# Patient Record
Sex: Male | Born: 1935 | Race: White | Hispanic: No | Smoking: Former smoker
Health system: Southern US, Community
[De-identification: ages and names within clinical notes are randomized; demographics above are authoritative.]

## PROBLEM LIST (undated history)

## (undated) DIAGNOSIS — T8859XA Other complications of anesthesia, initial encounter: Secondary | ICD-10-CM

## (undated) DIAGNOSIS — H409 Unspecified glaucoma: Secondary | ICD-10-CM

## (undated) DIAGNOSIS — F419 Anxiety disorder, unspecified: Secondary | ICD-10-CM

## (undated) DIAGNOSIS — D649 Anemia, unspecified: Secondary | ICD-10-CM

## (undated) DIAGNOSIS — T7840XA Allergy, unspecified, initial encounter: Secondary | ICD-10-CM

## (undated) DIAGNOSIS — E039 Hypothyroidism, unspecified: Secondary | ICD-10-CM

## (undated) DIAGNOSIS — C189 Malignant neoplasm of colon, unspecified: Secondary | ICD-10-CM

## (undated) DIAGNOSIS — T4145XA Adverse effect of unspecified anesthetic, initial encounter: Secondary | ICD-10-CM

## (undated) HISTORY — DX: Unspecified glaucoma: H40.9

## (undated) HISTORY — DX: Hypothyroidism, unspecified: E03.9

## (undated) HISTORY — DX: Malignant neoplasm of colon, unspecified: C18.9

## (undated) HISTORY — DX: Anemia, unspecified: D64.9

## (undated) HISTORY — DX: Allergy, unspecified, initial encounter: T78.40XA

## (undated) HISTORY — DX: Anxiety disorder, unspecified: F41.9

---

## 1989-01-04 HISTORY — PX: TEAR DUCT PROBING: SHX793

## 2001-03-06 ENCOUNTER — Encounter: Payer: Self-pay | Admitting: Emergency Medicine

## 2001-03-06 ENCOUNTER — Emergency Department (HOSPITAL_COMMUNITY): Admission: EM | Admit: 2001-03-06 | Discharge: 2001-03-06 | Payer: Self-pay | Admitting: Emergency Medicine

## 2001-05-06 DIAGNOSIS — C189 Malignant neoplasm of colon, unspecified: Secondary | ICD-10-CM

## 2001-05-06 HISTORY — PX: COLECTOMY: SHX59

## 2001-05-06 HISTORY — DX: Malignant neoplasm of colon, unspecified: C18.9

## 2001-06-01 ENCOUNTER — Encounter: Payer: Self-pay | Admitting: General Surgery

## 2001-06-01 ENCOUNTER — Ambulatory Visit (HOSPITAL_COMMUNITY): Admission: RE | Admit: 2001-06-01 | Discharge: 2001-06-01 | Payer: Self-pay | Admitting: General Surgery

## 2001-06-08 ENCOUNTER — Inpatient Hospital Stay (HOSPITAL_COMMUNITY): Admission: RE | Admit: 2001-06-08 | Discharge: 2001-06-13 | Payer: Self-pay | Admitting: General Surgery

## 2001-06-08 ENCOUNTER — Encounter (INDEPENDENT_AMBULATORY_CARE_PROVIDER_SITE_OTHER): Payer: Self-pay | Admitting: Specialist

## 2002-03-16 ENCOUNTER — Encounter: Payer: Self-pay | Admitting: *Deleted

## 2002-03-16 ENCOUNTER — Ambulatory Visit (HOSPITAL_COMMUNITY): Admission: RE | Admit: 2002-03-16 | Discharge: 2002-03-16 | Payer: Self-pay | Admitting: *Deleted

## 2003-05-07 HISTORY — PX: CATARACT EXTRACTION W/ INTRAOCULAR LENS  IMPLANT, BILATERAL: SHX1307

## 2003-05-19 ENCOUNTER — Ambulatory Visit (HOSPITAL_COMMUNITY): Admission: RE | Admit: 2003-05-19 | Discharge: 2003-05-19 | Payer: Self-pay | Admitting: Oncology

## 2003-11-18 ENCOUNTER — Ambulatory Visit (HOSPITAL_COMMUNITY): Admission: RE | Admit: 2003-11-18 | Discharge: 2003-11-18 | Payer: Self-pay | Admitting: Oncology

## 2003-11-23 ENCOUNTER — Ambulatory Visit (HOSPITAL_COMMUNITY): Admission: RE | Admit: 2003-11-23 | Discharge: 2003-11-23 | Payer: Self-pay | Admitting: Oncology

## 2004-05-17 ENCOUNTER — Ambulatory Visit: Payer: Self-pay | Admitting: Oncology

## 2004-05-18 ENCOUNTER — Ambulatory Visit (HOSPITAL_COMMUNITY): Admission: RE | Admit: 2004-05-18 | Discharge: 2004-05-18 | Payer: Self-pay | Admitting: Oncology

## 2004-11-14 ENCOUNTER — Ambulatory Visit: Payer: Self-pay | Admitting: Oncology

## 2004-11-16 ENCOUNTER — Ambulatory Visit (HOSPITAL_COMMUNITY): Admission: RE | Admit: 2004-11-16 | Discharge: 2004-11-16 | Payer: Self-pay | Admitting: Oncology

## 2004-11-20 ENCOUNTER — Ambulatory Visit: Payer: Self-pay | Admitting: Hematology and Oncology

## 2005-05-22 ENCOUNTER — Ambulatory Visit: Payer: Self-pay | Admitting: Hematology and Oncology

## 2005-05-31 ENCOUNTER — Ambulatory Visit (HOSPITAL_COMMUNITY): Admission: RE | Admit: 2005-05-31 | Discharge: 2005-05-31 | Payer: Self-pay | Admitting: Hematology and Oncology

## 2006-02-11 ENCOUNTER — Ambulatory Visit: Payer: Self-pay | Admitting: Hematology and Oncology

## 2006-02-13 LAB — CBC WITH DIFFERENTIAL/PLATELET
BASO%: 0.7 % (ref 0.0–2.0)
Basophils Absolute: 0 10*3/uL (ref 0.0–0.1)
Eosinophils Absolute: 0.4 10*3/uL (ref 0.0–0.5)
HCT: 42.6 % (ref 38.7–49.9)
HGB: 14.6 g/dL (ref 13.0–17.1)
MONO#: 0.6 10*3/uL (ref 0.1–0.9)
NEUT#: 4.3 10*3/uL (ref 1.5–6.5)
NEUT%: 65 % (ref 40.0–75.0)
Platelets: 206 10*3/uL (ref 145–400)
WBC: 6.7 10*3/uL (ref 4.0–10.0)
lymph#: 1.2 10*3/uL (ref 0.9–3.3)

## 2006-02-13 LAB — COMPREHENSIVE METABOLIC PANEL
ALT: 17 U/L (ref 0–40)
BUN: 17 mg/dL (ref 6–23)
CO2: 28 mEq/L (ref 19–32)
Calcium: 8.6 mg/dL (ref 8.4–10.5)
Chloride: 106 mEq/L (ref 96–112)
Creatinine, Ser: 0.82 mg/dL (ref 0.40–1.50)
Glucose, Bld: 115 mg/dL — ABNORMAL HIGH (ref 70–99)

## 2006-11-11 ENCOUNTER — Ambulatory Visit: Payer: Self-pay | Admitting: Hematology and Oncology

## 2006-11-13 LAB — CBC WITH DIFFERENTIAL/PLATELET
BASO%: 0.4 % (ref 0.0–2.0)
Eosinophils Absolute: 0.3 10*3/uL (ref 0.0–0.5)
MCHC: 36.3 g/dL — ABNORMAL HIGH (ref 32.0–35.9)
MCV: 90.8 fL (ref 81.6–98.0)
MONO#: 0.6 10*3/uL (ref 0.1–0.9)
MONO%: 11.7 % (ref 0.0–13.0)
NEUT#: 3.2 10*3/uL (ref 1.5–6.5)
RBC: 4.26 10*6/uL (ref 4.20–5.71)
RDW: 13.4 % (ref 11.2–14.6)
WBC: 4.9 10*3/uL (ref 4.0–10.0)

## 2006-11-13 LAB — COMPREHENSIVE METABOLIC PANEL
ALT: 16 U/L (ref 0–53)
Albumin: 3.6 g/dL (ref 3.5–5.2)
Alkaline Phosphatase: 65 U/L (ref 39–117)
Glucose, Bld: 95 mg/dL (ref 70–99)
Potassium: 4.2 mEq/L (ref 3.5–5.3)
Sodium: 138 mEq/L (ref 135–145)
Total Protein: 5.3 g/dL — ABNORMAL LOW (ref 6.0–8.3)

## 2006-11-14 ENCOUNTER — Ambulatory Visit (HOSPITAL_COMMUNITY): Admission: RE | Admit: 2006-11-14 | Discharge: 2006-11-14 | Payer: Self-pay | Admitting: Hematology and Oncology

## 2007-08-04 ENCOUNTER — Ambulatory Visit: Payer: Self-pay | Admitting: Hematology and Oncology

## 2007-08-06 LAB — CBC WITH DIFFERENTIAL/PLATELET
Basophils Absolute: 0.1 10*3/uL (ref 0.0–0.1)
EOS%: 4.4 % (ref 0.0–7.0)
Eosinophils Absolute: 0.2 10*3/uL (ref 0.0–0.5)
HGB: 14.1 g/dL (ref 13.0–17.1)
LYMPH%: 16.6 % (ref 14.0–48.0)
MCH: 32.4 pg (ref 28.0–33.4)
MCV: 92.4 fL (ref 81.6–98.0)
MONO%: 8.8 % (ref 0.0–13.0)
NEUT#: 3.8 10*3/uL (ref 1.5–6.5)
Platelets: 197 10*3/uL (ref 145–400)
RBC: 4.36 10*6/uL (ref 4.20–5.71)
RDW: 12.9 % (ref 11.2–14.6)

## 2007-08-06 LAB — COMPREHENSIVE METABOLIC PANEL
AST: 14 U/L (ref 0–37)
Alkaline Phosphatase: 85 U/L (ref 39–117)
BUN: 15 mg/dL (ref 6–23)
Glucose, Bld: 154 mg/dL — ABNORMAL HIGH (ref 70–99)
Total Bilirubin: 1.6 mg/dL — ABNORMAL HIGH (ref 0.3–1.2)

## 2007-08-06 LAB — CEA: CEA: <0.5 ng/mL (ref 0.0–5.0)

## 2007-08-20 ENCOUNTER — Ambulatory Visit: Payer: Self-pay | Admitting: Internal Medicine

## 2007-09-03 ENCOUNTER — Encounter: Payer: Self-pay | Admitting: Internal Medicine

## 2007-09-03 ENCOUNTER — Ambulatory Visit: Payer: Self-pay | Admitting: Internal Medicine

## 2007-12-09 ENCOUNTER — Ambulatory Visit: Payer: Self-pay | Admitting: Internal Medicine

## 2007-12-23 ENCOUNTER — Encounter: Payer: Self-pay | Admitting: Internal Medicine

## 2007-12-23 ENCOUNTER — Ambulatory Visit: Payer: Self-pay | Admitting: Internal Medicine

## 2007-12-25 ENCOUNTER — Encounter: Payer: Self-pay | Admitting: Internal Medicine

## 2008-02-29 ENCOUNTER — Ambulatory Visit: Payer: Self-pay | Admitting: Hematology and Oncology

## 2008-03-02 ENCOUNTER — Ambulatory Visit (HOSPITAL_COMMUNITY): Admission: RE | Admit: 2008-03-02 | Discharge: 2008-03-02 | Payer: Self-pay | Admitting: Hematology and Oncology

## 2008-03-02 LAB — CBC WITH DIFFERENTIAL/PLATELET
Basophils Absolute: 0 10*3/uL (ref 0.0–0.1)
EOS%: 3.3 % (ref 0.0–7.0)
HCT: 42.2 % (ref 38.7–49.9)
HGB: 14.7 g/dL (ref 13.0–17.1)
MCH: 32.3 pg (ref 28.0–33.4)
MONO#: 0.5 10*3/uL (ref 0.1–0.9)
NEUT#: 3.6 10*3/uL (ref 1.5–6.5)
RDW: 12.9 % (ref 11.2–14.6)
WBC: 5.6 10*3/uL (ref 4.0–10.0)
lymph#: 1.2 10*3/uL (ref 0.9–3.3)

## 2008-03-02 LAB — COMPREHENSIVE METABOLIC PANEL
ALT: 20 U/L (ref 0–53)
AST: 25 U/L (ref 0–37)
Albumin: 3.6 g/dL (ref 3.5–5.2)
BUN: 10 mg/dL (ref 6–23)
CO2: 26 mEq/L (ref 19–32)
Calcium: 9.1 mg/dL (ref 8.4–10.5)
Chloride: 106 mEq/L (ref 96–112)
Potassium: 4 mEq/L (ref 3.5–5.3)

## 2008-03-02 LAB — CEA: CEA: 0.7 ng/mL (ref 0.0–5.0)

## 2008-03-09 ENCOUNTER — Encounter: Payer: Self-pay | Admitting: Internal Medicine

## 2008-11-18 ENCOUNTER — Encounter (INDEPENDENT_AMBULATORY_CARE_PROVIDER_SITE_OTHER): Payer: Self-pay | Admitting: *Deleted

## 2009-01-12 ENCOUNTER — Ambulatory Visit: Payer: Self-pay | Admitting: Internal Medicine

## 2009-01-19 ENCOUNTER — Ambulatory Visit: Payer: Self-pay | Admitting: Internal Medicine

## 2009-01-19 ENCOUNTER — Encounter: Payer: Self-pay | Admitting: Internal Medicine

## 2009-01-20 ENCOUNTER — Encounter: Payer: Self-pay | Admitting: Internal Medicine

## 2009-03-07 ENCOUNTER — Ambulatory Visit: Payer: Self-pay | Admitting: Hematology and Oncology

## 2009-03-09 LAB — CBC WITH DIFFERENTIAL/PLATELET
BASO%: 0.7 % (ref 0.0–2.0)
EOS%: 6.8 % (ref 0.0–7.0)
HCT: 38.8 % (ref 38.4–49.9)
MCH: 32.8 pg (ref 27.2–33.4)
MCHC: 34.1 g/dL (ref 32.0–36.0)
MONO#: 0.6 10*3/uL (ref 0.1–0.9)
NEUT%: 63.7 % (ref 39.0–75.0)
RBC: 4.03 10*6/uL — ABNORMAL LOW (ref 4.20–5.82)
RDW: 13.9 % (ref 11.0–14.6)
WBC: 5.3 10*3/uL (ref 4.0–10.3)
lymph#: 0.9 10*3/uL (ref 0.9–3.3)

## 2009-03-09 LAB — COMPREHENSIVE METABOLIC PANEL
ALT: 12 U/L (ref 0–53)
BUN: 20 mg/dL (ref 6–23)
CO2: 26 mEq/L (ref 19–32)
Calcium: 8.7 mg/dL (ref 8.4–10.5)
Chloride: 107 mEq/L (ref 96–112)
Creatinine, Ser: 0.83 mg/dL (ref 0.40–1.50)
Glucose, Bld: 83 mg/dL (ref 70–99)
Total Bilirubin: 0.7 mg/dL (ref 0.3–1.2)

## 2009-03-09 LAB — CEA: CEA: 0.6 ng/mL (ref 0.0–5.0)

## 2009-03-16 ENCOUNTER — Encounter: Payer: Self-pay | Admitting: Internal Medicine

## 2009-12-07 ENCOUNTER — Encounter: Payer: Self-pay | Admitting: Internal Medicine

## 2009-12-19 ENCOUNTER — Encounter (INDEPENDENT_AMBULATORY_CARE_PROVIDER_SITE_OTHER): Payer: Self-pay | Admitting: *Deleted

## 2010-01-22 ENCOUNTER — Encounter (INDEPENDENT_AMBULATORY_CARE_PROVIDER_SITE_OTHER): Payer: Self-pay | Admitting: *Deleted

## 2010-01-24 ENCOUNTER — Ambulatory Visit: Payer: Self-pay | Admitting: Internal Medicine

## 2010-02-07 ENCOUNTER — Ambulatory Visit: Payer: Self-pay | Admitting: Internal Medicine

## 2010-02-08 ENCOUNTER — Encounter: Payer: Self-pay | Admitting: Internal Medicine

## 2010-03-08 ENCOUNTER — Ambulatory Visit: Payer: Self-pay | Admitting: Hematology and Oncology

## 2010-03-12 LAB — COMPREHENSIVE METABOLIC PANEL
ALT: 10 U/L (ref 0–53)
BUN: 18 mg/dL (ref 6–23)
CO2: 24 mEq/L (ref 19–32)
Calcium: 8.7 mg/dL (ref 8.4–10.5)
Chloride: 106 mEq/L (ref 96–112)
Creatinine, Ser: 0.76 mg/dL (ref 0.40–1.50)

## 2010-03-12 LAB — CBC WITH DIFFERENTIAL/PLATELET
Basophils Absolute: 0 10*3/uL (ref 0.0–0.1)
HCT: 35.2 % — ABNORMAL LOW (ref 38.4–49.9)
HGB: 12.2 g/dL — ABNORMAL LOW (ref 13.0–17.1)
MONO#: 0.4 10*3/uL (ref 0.1–0.9)
NEUT%: 77.8 % — ABNORMAL HIGH (ref 39.0–75.0)
WBC: 6.5 10*3/uL (ref 4.0–10.3)
lymph#: 0.8 10*3/uL — ABNORMAL LOW (ref 0.9–3.3)

## 2010-03-12 LAB — CEA: CEA: <0.5 ng/mL (ref 0.0–5.0)

## 2010-03-13 ENCOUNTER — Encounter: Payer: Self-pay | Admitting: Internal Medicine

## 2010-03-13 LAB — RETICULOCYTES
Retic %: 1.62 % — ABNORMAL HIGH (ref 0.50–1.60)
Retic Ct Abs: 61.72 10*3/uL (ref 24.10–77.50)

## 2010-03-14 ENCOUNTER — Ambulatory Visit (HOSPITAL_COMMUNITY): Admission: RE | Admit: 2010-03-14 | Discharge: 2010-03-14 | Payer: Self-pay | Admitting: Hematology and Oncology

## 2010-03-15 LAB — DIRECT ANTIGLOBULIN TEST (NOT AT ARMC)
DAT (Complement): NEGATIVE
DAT IgG: NEGATIVE

## 2010-03-15 LAB — FERRITIN: Ferritin: 87 ng/mL (ref 22–322)

## 2010-03-15 LAB — PROTEIN ELECTROPHORESIS, SERUM, WITH REFLEX
Beta 2: 3.9 % (ref 3.2–6.5)
Gamma Globulin: 12.1 % (ref 11.1–18.8)

## 2010-03-15 LAB — IRON AND TIBC: TIBC: 349 ug/dL (ref 215–435)

## 2010-03-15 LAB — HAPTOGLOBIN: Haptoglobin: 151 mg/dL (ref 16–200)

## 2010-06-05 NOTE — Letter (Signed)
Summary: [facility]  [facility]   Imported By: Lennie Odor 03/19/2010 11:39:48  _____________________________________________________________________  External Attachment:    Type:   Image     Comment:   External Document

## 2010-06-05 NOTE — Letter (Signed)
Summary: Patient Notice- Polyp Results  [facility]  [address] [place], [place] [address]   Phone: [phone]  Fax: [phone]        February 08, 2010 MRN: [id number]    [facility] [address] [place], [place]  [address]    Dear [name] Hall,  I am pleased to inform you that the colon polyp(s) removed during your recent colonoscopy was (were) found to be benign (no cancer detected) upon pathologic examination.  I recommend you have a repeat colonoscopy examination in 2 years to look for recurrent polyps, as having colon polyps increases your risk for having recurrent polyps or even colon cancer in the future.  Should you develop new or worsening symptoms of abdominal pain, bowel habit changes or bleeding from the rectum or bowels, please schedule an evaluation with either your primary care physician or with me.  Additional information/recommendations:  __ No further action with gastroenterology is needed at this time. Please      follow-up with your primary care physician for your other healthcare      needs.    Please call us if you are having persistent problems or have questions about your condition that have not been fully answered at this time.  Sincerely,  Hilarie Fredrickson MD  This letter has been electronically signed by your physician.  Appended Document: Patient Notice- Polyp Results letter mailed

## 2010-06-05 NOTE — Letter (Signed)
[facility]   Imported By: Sherian Rein 06/01/2009 14:21:16  _____________________________________________________________________  External Attachment:    Type:   Image     Comment:   External Document

## 2010-06-05 NOTE — Letter (Signed)
Summary: Colonoscopy Letter  [facility]  [address] [place], [place] [address]   Phone: [phone]  Fax: [phone]      December 07, 2009 MRN: [id number]   [facility] [address] [place], [place]  [address]   Dear Mr. STOFFERS,   According to your medical record, it is time for you to schedule a Colonoscopy. The American Cancer Society recommends this procedure as a method to detect early colon cancer. Patients with a family history of colon cancer, or a personal history of colon polyps or inflammatory bowel disease are at increased risk.  This letter has been generated based on the recommendations made at the time of your procedure. If you feel that in your particular situation this may no longer apply, please contact our office.  Please call our office at [phone] to schedule this appointment or to update your records at your earliest convenience.  Thank you for cooperating with [place] to provide you with the very best care possible.   Sincerely,  Wilhemina Bonito. Marina Goodell, M.D.  [facility] [phone]

## 2010-06-05 NOTE — Miscellaneous (Signed)
Summary: previsit prep/rm  Clinical Lists Changes  Medications: Added new medication of MOVIPREP 100 GM  SOLR (PEG-KCL-NACL-NASULF-NA ASC-C) As per prep instructions. - Signed Rx of MOVIPREP 100 GM  SOLR (PEG-KCL-NACL-NASULF-NA ASC-C) As per prep instructions.;  #1 x 0;  Signed;  Entered by: Sherren Kerns RN;  Authorized by: Hilarie Fredrickson MD;  Method used: [facility] [phone]*, [address] [place], [place]  [address], Ph: [phone] or [phone], Fax: [phone] Allergies: Added new allergy or adverse reaction of * LAMASIL Added new allergy or adverse reaction of SULFA Observations: Added new observation of ALLERGY REV: Done (01/24/2010 10:35)    Prescriptions: MOVIPREP 100 GM  SOLR (PEG-KCL-NACL-NASULF-NA ASC-C) As per prep instructions.  #1 x 0   Entered by:   Sherren Kerns RN   Authorized by:   Hilarie Fredrickson MD   Signed by:   Sherren Kerns RN on 01/24/2010   Method used:   [facility]ronically to        [facility] [phone]* (retail)       [address]       [place], [place]  [address]       Ph: [phone] or [phone]       Fax: [phone]   RxID:   [id number]

## 2010-06-05 NOTE — Letter (Signed)
Summary: Pre Visit Letter Revised  [facility]  [address] [place], [place] [address]   Phone: [phone]  Fax: [phone]    12/19/2009 MRN: [id number]  [facility] [address] [place], [place]  [address]              Procedure Date:  02-07-10    Welcome to the Gastroenterology Division at [facility].    You are scheduled to see a nurse for your pre-procedure visit on 01-24-10 at 11:00a.m. on the 3rd floor at [facility], [address]  We ask that you try to arrive at our office 15 minutes prior to your appointment time to allow for check-in.  Please take a minute to review the attached form.  If you answer "Yes" to one or more of the questions on the first page, we ask that you call the person listed at your earliest opportunity.  If you answer "No" to all of the questions, please complete the rest of the form and bring it to your appointment.    Your nurse visit will consist of discussing your medical and surgical history, your immediate family medical history, and your medications.    If you are unable to list all of your medications on the form, please bring the medication bottles to your appointment and we will list them.  We will need to be aware of both prescribed and over the counter drugs.  We will need to know exact dosage information as well.    Please be prepared to read and sign documents such as consent forms, a financial agreement, and acknowledgement forms.  If necessary, and with your consent, a friend or relative is welcome to sit-in on the nurse visit with you.  Please bring your insurance card so that we may make a copy of it.  If your insurance requires a referral to see a specialist, please bring your referral form from your primary care physician.  No co-pay is required for this nurse visit.     If you cannot keep your appointment, please call [phone] to cancel or reschedule prior to your appointment date.  This allows [place]  the opportunity to schedule an appointment for another patient in need of care.   Thank you for choosing [facility] for your medical needs.  We appreciate the opportunity to care for you.  Please visit [place] at our website  to learn more about our practice.                     Sincerely,  The Gastroenterology Division

## 2010-06-05 NOTE — Letter (Signed)
Summary: [facility] Instructions  [facility]  [address] [place], [place] [address]   Phone: [phone]  Fax: [phone]       [name] Hall    1933-04-05    MRN: [id number]        Procedure Day Dorna Bloom: Wednesday 02-07-10     Arrival Time: 7:30 a.m.     Procedure Time: 8:30 a.m.     Location of Procedure:                    _x _  [facility] (4th Floor)   PREPARATION FOR COLONOSCOPY WITH MOVIPREP   Starting 5 days prior to your procedure  02-02-10 do not eat nuts, seeds, popcorn, corn, beans, peas,  salads, or any raw vegetables.  Do not take any fiber supplements (e.g. Metamucil, Citrucel, and Benefiber).  THE DAY BEFORE YOUR PROCEDURE         DATE:  02-06-10  DAY:  Tuesday  1.  Drink clear liquids the entire day-NO SOLID FOOD  2.  Do not drink anything colored red or purple.  Avoid juices with pulp.  No orange juice.  3.  Drink at least 64 oz. (8 glasses) of fluid/clear liquids during the day to prevent dehydration and help the prep work efficiently.  CLEAR LIQUIDS INCLUDE: Water Jello Ice Popsicles Tea (sugar ok, no milk/cream) Powdered fruit flavored drinks Coffee (sugar ok, no milk/cream) Gatorade Juice: apple, white grape, white cranberry  Lemonade Clear bullion, consomm, broth Carbonated beverages (any kind) Strained chicken noodle soup Hard Candy                             4.  In the morning, mix first dose of MoviPrep solution:    Empty 1 Pouch A and 1 Pouch B into the disposable container    Add lukewarm drinking water to the top line of the container. Mix to dissolve    Refrigerate (mixed solution should be used within 24 hrs)  5.  Begin drinking the prep at 5:00 p.m. The MoviPrep container is divided by 4 marks.   Every 15 minutes drink the solution down to the next mark (approximately 8 oz) until the full liter is complete.   6.  Follow completed prep with 16 oz of clear liquid of your choice (Nothing red or purple).   Continue to drink clear liquids until bedtime.  7.  Before going to bed, mix second dose of MoviPrep solution:    Empty 1 Pouch A and 1 Pouch B into the disposable container    Add lukewarm drinking water to the top line of the container. Mix to dissolve    Refrigerate  THE DAY OF YOUR PROCEDURE      DATE:  02-07-10  DAY:  Wednesday  Beginning at  3:30 a.m. (5 hours before procedure):         1. Every 15 minutes, drink the solution down to the next mark (approx 8 oz) until the full liter is complete.  2. Follow completed prep with 16 oz. of clear liquid of your choice.    3. You may drink clear liquids until  6:30 a.m. (2 HOURS BEFORE PROCEDURE).   MEDICATION INSTRUCTIONS  Unless otherwise instructed, you should take regular prescription medications with a small sip of water   as early as possible the morning of your procedure.    Additional medication instructions: n/a  OTHER INSTRUCTIONS  You will need a responsible adult at least 75 years of age to accompany you and drive you home.   This person must remain in the waiting room during your procedure.  Wear loose fitting clothing that is easily removed.  Leave jewelry and other valuables at home.  However, you may wish to bring a book to read or  an iPod/MP3 player to listen to music as you wait for your procedure to start.  Remove all body piercing jewelry and leave at home.  Total time from sign-in until discharge is approximately 2-3 hours.  You should go home directly after your procedure and rest.  You can resume normal activities the  day after your procedure.  The day of your procedure you should not:   Drive   Make legal decisions   Operate machinery   Drink alcohol   Return to work  You will receive specific instructions about eating, activities and medications before you leave.    The above instructions have been reviewed and explained to me by   Sherren Kerns RN  January 24, 2010  11:37 AM    I fully understand and can verbalize these instructions _____________________________ Date _________

## 2010-06-05 NOTE — Procedures (Signed)
Summary: Colonoscopy  Patient: [place] Note: All result statuses are Final unless otherwise noted.  Tests: (1) Colonoscopy (COL)   COL Colonoscopy           DONE     [facility]     [address]     [place], [place]  [address]           COLONOSCOPY PROCEDURE REPORT           PATIENT:  [name] Hall, [name] Hall  MR#:  [id number]     BIRTHDATE:  10-Jul-1932, 77 yrs. old  GENDER:  male     ENDOSCOPIST:  Rashun Grattan. Eda Keys, MD     REF. BY:  Surveillance Program Recall,     PROCEDURE DATE:  02/07/2010     PROCEDURE:  Colonoscopy with snare polypectomy x 4     ASA CLASS:  Class II     INDICATIONS:  history of colon cancer, history of pre-cancerous     (adenomatous) colon polyps, surveillance and high-risk screening ;     index 2003, f/u 2004,5,9 (piecemeal), 9 (piecemeal), 10     (piecemeal)     MEDICATIONS:   Fentanyl 100 mcg IV, Versed 10 mg IV           DESCRIPTION OF PROCEDURE:   After the risks benefits and     alternatives of the procedure were thoroughly explained, informed     consent was obtained.  Digital rectal exam was performed and     revealed no abnormalities.   The LB 180AL [id number] endoscope was     introduced through the anus and advanced to the cecum, which was     identified by both the appendix and ileocecal valve, without     limitations.Time to cecum = 1:31 min.  The quality of the prep was     excellent, using MoviPrep.  The instrument was then slowly     withdrawn (time = 10;13 min) as the colon was fully examined.     <<PROCEDUREIMAGES>>           FINDINGS:  Four polyps, all < 5mm, were found in the distal     transverse colon. Polyps were snared without cautery. Retrieval     was successful in 3 of 4.  The prior tattoo / polypectomy site was     easily identified and devoid of any residual polypoid tissue.     Moderate diverticulosis was found throughout the colon, esp right     colon.  There was evidence of a prior segmental colectomy with     anastomosis  at 38cm.   Retroflexed views in the rectum revealed     internal hemorrhoids.    The scope was then withdrawn from the     patient and the procedure completed.           COMPLICATIONS:  None     ENDOSCOPIC IMPRESSION:     1) Four small polyps in the distal transverse colon - removed     2) Moderate diverticulosis throughout the colon     3) Prior segmental colectomy     4) Internal hemorrhoids           RECOMMENDATIONS:     1) Follow up colonoscopy in 2 years           ______________________________     Wilhemina Bonito. Eda Keys, MD           CC:  Rodrigo Ran, MD; Vicente Serene  Odogwu, MD; The Patient           n.     eSIGNED:   Wilhemina Bonito. Eda Keys at 02/07/2010 09:17 AM           Nanine Means, [id number]  Note: An exclamation mark (!) indicates a result that was not dispersed into the flowsheet. Document Creation Date: 02/07/2010 9:18 AM _______________________________________________________________________  (1) Order result status: Final Collection or observation date-time: 02/07/2010 09:05 Requested date-time:  Receipt date-time:  Reported date-time:  Referring Physician:   Ordering Physician: Fransico Setters [phone]) Specimen Source:  Source: Launa Grill Order Number: [id number] Lab site:   Appended Document: Colonoscopy     Procedures Next Due Date:    Colonoscopy: 02/2012

## 2010-09-21 NOTE — Discharge Summary (Signed)
[facility]  Patient:    [name] [name], BIHL Visit Number: [id number] MRN: [id number]          Service Type: SUR Location: 4W 0448 01 Attending Physician:  Tempie Donning Dictated by:   Gita Kudo, M.D. Admit Date:  06/08/2001 Discharge Date: 06/13/2001   CC:         Rodrigo Ran, M.D.  Wilhemina Bonito. Eda Keys., M.D. [facility]  [place] C. Catha Gosselin, M.D.   Discharge Summary  CHIEF COMPLAINT:  Cancer at left colon.  HISTORY OF PRESENT ILLNESS:  This is a 77 year old man who presented with cancer of the colon, proven by colonoscopy.  This was performed because of constipation and Hemoccult positive stool leading to anemia and then to workup.  A CEA is elevated at 6.5.  An abdominal and pelvic CT scan is negative.  LABORATORY DATA AND X-RAY FINDINGS:  The patient had a T3 cancer of the left colon.  It was approximately 7 cm in size, seven benign lymph nodes, margins free, extending into the adipose tissue.  Initial hemoglobin of 14, follow up hemoglobin was 11 and at discharge 12. Hematocrit 42, 31 and 34.  White count was normal.  CMET was normal except for elevated blood sugar of 126 and 154.  Initial potassium was normal and followup 2.5, sodium slightly low at 134.  Bilirubin slightly elevated at 1.7 and then 1.4.  A chest x-ray showed no acute disease, mild tortuous aorta.  EKG was abnormal with possible inferior infarct of undetermined age.  HOSPITAL COURSE:  On the morning of admission, the patient underwent an uneventful resection of a descending colon cancer with primary anastomosis. He tolerated the procedure well and had no postop complications.  His nasogastric and Foley catheters were removed on scheduled and he regained bowel function.  Accordingly, his IV was stopped.  He was maintained on subcu heparin for three days and this was discontinued.  He was seen in consultation by Dr. Lyndal Pulley.  He continued to improve and on postop day #5,  was discharged.  DIET:  Regular diet.  ACTIVITY:  Limited activity.  DISCHARGE MEDICATION:  Maxidone for pain.  FOLLOWUP:  He will be followed up by Dr. Waynard Edwards, Dr. Catha Gosselin and Dr. Maryagnes Amos.  DISCHARGE DIAGNOSIS:  Cancer of descending colon, stage T3 N0.  PROCEDURE:  Resection of colon cancer of descending colon.  COMPLICATIONS/INFECTIONS:  None.  CONSULTATION:  Dr. Lyndal Pulley.  CONDITION ON DISCHARGE:  Good. Dictated by:   Gita Kudo, M.D. Attending Physician:  Tempie Donning DD:  06/13/01 TD:  06/15/01 Job: [id number]

## 2010-09-21 NOTE — H&P (Signed)
[facility]  Patient:    [name] [name], [name] [name] Visit Number: [id number] MRN: [id number]          Service Type: SUR Location: 1S X004 01 Attending Physician:  Tempie Donning Dictated by:   Gita Kudo, M.D. Admit Date:  06/08/2001                           History and Physical  CHIEF COMPLAINT:  Colon cancer.  HISTORY OF PRESENT ILLNESS:  The patient is a 77 year old male admitted for elective colon resection.  Because of a several-month history of constipation and finding of heme-positive stool and mild anemia, he underwent colonoscopy. Dr. Marina Goodell found a biopsy-proven carcinoma of the left colon, descending.  His upper endoscopy was normal.  These were both done on May 26, 2001.  A preoperative CT scan of the abdomen was negative.  A CEA is elevated at 6.0.  PAST SURGICAL HISTORY:  Surgery for glaucoma, uses eyedrops.  PAST MEDICAL HISTORY:  He has no significant illnesses.  ALLERGIES:  SULFA and CODEINE.  MEDICATIONS:  He takes no other medications.  REVIEW OF SYSTEMS:  Good general health.  He has had numbness in his legs and was observed of this, and no specific diagnosis was made.  He has no significant cardiac, pulmonary, GU, or musculoskeletal symptoms.  FAMILY HISTORY:  Positive for cancer; grandmother possibly had colon cancer. Father had prostate and gastric cancer.  SOCIAL HISTORY:  Married.  Does not smoke and does not use alcohol.  PHYSICAL EXAMINATION:  GENERAL:  Alert, cooperative male.  VITAL SIGNS:  Are recorded on the chart.  Blood pressure 160/80, respirations 20, pulse 76, temperature 97.6.  Weight 202.  HEENT:  Head normal.  ENT, no obstruction or infection.  NECK:  Supple.  CHEST:  Clear.  HEART:  Extra systoles.  No murmur.  ABDOMEN:  Soft.  No masses or tenderness.  There is no hernia.  RECTAL:  Done in the office and was negative.  EXTREMITIES:  No deformity or edema, although he does have some  stasis changes on both legs.  IMPRESSION:  Carcinoma, descending colon.  PLAN:  The patient has had outpatient bowel prep and will undergo colectomy. Dictated by:   Gita Kudo, M.D. Attending Physician:  Tempie Donning DD:  06/08/01 TD:  06/08/01 Job: [id number]

## 2010-09-21 NOTE — Op Note (Signed)
[facility]  Patient:    [name], [name] Visit Number: [id number] MRN: [id number]          Service Type: SUR Location: 4W 0448 01 Attending Physician:  Tempie Donning Dictated by:   Gita Kudo, M.D. Proc. Date: 06/08/01 Admit Date:  06/08/2001   CC:         Rodrigo Ran, M.D.  Wilhemina Bonito. Eda Keys., M.D. [facility]   Operative Report  OPERATIVE PROCEDURE:  Resection descending colon with primary transverse - sigmoid colostomy.  SURGEON:  Gita Kudo, M.D.  ASSISTANT:  Donnie Coffin. Samuella Cota, M.D.  ANESTHESIA:  General endotracheal.  PREOPERATIVE DIAGNOSIS:  Carcinoma of the colon.  POSTOPERATIVE DIAGNOSIS:  Carcinoma of the colon, no gross metastatic disease, bulky tumor just distal to the splenic flexure.  CLINICAL SUMMARY:  A 77 year old male with constipation and anemia.  Hemoccult stools led to colonoscopy, and a cancer was found of the descending colon. Could not be scoped through.  CEA elevated at 6, abdominal and pelvic CT scans negative.  OPERATIVE FINDINGS:  A full laparotomy was performed.  The patients stomach was normal.  His proximal jejunum had several large diverticula, but none of them were inflamed.  The remainder of the small bowel was normal.  The appendix appeared normal as did the cecum, ascending colon and transverse colon.  The distal sigmoid and rectum felt normal.  Just below the splenic flexure was a large, palpable mass, about 4-5 cm in size consistent with cancer.  The liver was palpably free of any tumor.  Gallbladder appeared normal.  The spleen felt normal and was not injured.  OPERATIVE PROCEDURE:  Under satisfactory general endotracheal anesthesia, having received IV Cefotan preop, a preop bowel prep, and subcu heparin, his abdomen was prepped and draped in a standard fashion.  Nasogastric and Foley catheter was placed.  A midline incision was made and was extended proximally as we identified that the  splenic flexure would need to be taken down.  The abdomen was carefully examined, as mentioned above.  Then colectomy was performed.  Good exposure was obtained using self-retaining retractors and packing small bowel out of the way.  The lateral reflections of the peritoneum were taken down from the sigmoid colon up to the splenic flexure.  The splenic flexure was then taken down using both cautery and clamping, cutting, and tying with 2-0 silk.  This was carried medially, and the gastrohepatic omentum divided between clamps and ties of 2-0 silk.  At approximately the mid transverse colon, the colon was divided between bowel clamps and then the mesentery divided between clamps and ties all the way down to the proximal sigmoid colon.  There, the colon was divided between clamps and the specimen removed. The spleen was not injured; the kidney was felt throughout and not injured. We were operating far laterally and above the ureter, so I did not look for the ureter.  After the specimen was removed, a proximal occluding spring clamp was placed and then the edges of bowel freshened up.  A single layer silk anastomosis performed with 2-0 and 3-0 silk and simple, mattress in [place] fashion.  When completed, the clamp was removed, and there was no evidence of any leak. There was no tension; there was good blood supply.  The anastomosis felt patent.  Then the mesentery was approximated with interrupted silk sutures. The abdomen was carefully examined for any bleeding, and none was found. Hemostasis had been good by ties, suture ligatures,  and a few clips.  After the abdomen was suctioned dry, it was closed in the midline with a single layer of running #1 PDS suture.  The subcu was then lavaged with saline and the skin edges approximated with staples.  A small skin lesion was removed from his abdominal wall, and this also closed with staples.  Sterile, absorbent dressings were applied, and the  patient went to the recovery room from the operating room in good condition. Dictated by:   Gita Kudo, M.D. Attending Physician:  Tempie Donning DD:  06/08/01 TD:  06/08/01 Job: [id number]

## 2010-09-21 NOTE — Consult Note (Signed)
[facility]  Patient:    [name] [name], [name] [name] Visit Number: [id number] MRN: [id number]          Service Type: SUR Location: 4W [id number] Attending Physician:  Tempie Donning Dictated by:   Lowell C. Catha Gosselin, M.D. Proc. Date: 06/11/01 Admit Date:  06/08/2001   CC:         Gita Kudo, M.D.  Rodrigo Ran, M.D.  Jeralyn Bennett, M.D.  [facility] Rm. #[id number]   Consultation Report  SUMMARY:  The patient is a 77 year old male patient found to have heme-positive stools on routine physical exam by Dr. Rodrigo Ran.  He also had several months history of constipation requiring laxative use along with mild anemia.  An upper endoscopy was negative, and a colonoscopy performed by Dr. Marina Goodell showed a mass in the left descending colon, preventing completion of the colonoscopy.  He subsequently underwent a resection of descending colon June 07, 2001, for transverse sigmoid colectomy.  Pathology has returned showing a T3 N0 process S03-600.  Primary tumor size was 7 cm.  This was a grade 2 lesion.  Tested invasion was through the muscularis propria into the subserosal connective tissue, 0/7 pericolonic lymph nodes positive.  We are here to go over treatment options.  CEA preoperatively was 6.5, alkaline phosphatase 63.  PAST MEDICAL HISTORY: 1. Glaucoma. 2. Lower extremity edema.  PAST SURGICAL HISTORY:  Right eye surgery for glaucoma in 1991 and 1996.  ALLERGY:  SULFA which causes swelling, CODEINE which causes nervousness.  MEDICATIONS:  Cosopt and Alphagan eye g.t.t. b.i.d.  FAMILY HISTORY:  Positive for gastric and prostate carcinoma in his father.  SOCIAL HISTORY:  Lives with his wife, Britta Mccreedy in [place].  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  A 20-pack-year history of smoking until 1974.  Occasional social drinker.  LABORATORY STUDIES ON ADMISSION:  White count 6.2, hemoglobin 14.2, platelet count  270,000.  Sodium 137, potassium 3.6, CO2 26, glucose 93, BUN 15, creatinine .9.  Alkaline phosphatase 63, GOT 22, GPT 15, total protein 6.5, albumin 3.7.  Prior to surgery, CT imaging showed some small retroperitoneal mesenteric lymph nodes.  REVIEW OF SYSTEMS:  Fairly unremarkable.  No nausea or vomiting.  PHYSICAL EXAMINATION:  VITAL SIGNS:  Temperature 98.3, pulse 91, respirations 20, blood pressure 157/76.  HEENT:  Normocephalic, sclerae clear, pupils equal and reactive to light and accommodation.  No lesions, no plaque.  No lymph nodes felt in the cervical, supraclavicular, or inguinal areas.  ABDOMEN:  Question of distention versus obesity, slightly tender to palpitation along the surgical area, as expected.  EXTREMITIES:  No clubbing, cyanosis, or edema.  NEUROLOGIC:  Unremarkable.  ASSESSMENT:  A 77 year old gentleman with a T3 N0 process.  PLAN:  What we will plan to do is go ahead and see him back in our clinic in 2-3 weeks to consider adjuvant chemotherapy.  I did talk to him about that at some length today.  He has already had a CT scan of the abdomen and pelvis at [facility] on June 01, 2001, so that will not need to be repeated.  We did talk about probable leucovorin therapy x 6 months at some length today.  We will see him back in three weeks time to go over it again as an outpatient.  Thank you for allowing [place] to share in his care. Dictated by:   Lowell C. Catha Gosselin, M.D. Attending Physician:  Tempie Donning DD:  06/11/01 TD:  06/12/01 Job: [id number]

## 2011-03-18 ENCOUNTER — Other Ambulatory Visit: Payer: Self-pay | Admitting: *Deleted

## 2011-03-18 ENCOUNTER — Other Ambulatory Visit (HOSPITAL_BASED_OUTPATIENT_CLINIC_OR_DEPARTMENT_OTHER): Payer: Medicare Other | Admitting: Lab

## 2011-03-18 ENCOUNTER — Other Ambulatory Visit: Payer: Self-pay | Admitting: Hematology and Oncology

## 2011-03-18 DIAGNOSIS — C186 Malignant neoplasm of descending colon: Secondary | ICD-10-CM

## 2011-03-18 DIAGNOSIS — J984 Other disorders of lung: Secondary | ICD-10-CM

## 2011-03-18 LAB — CBC WITH DIFFERENTIAL/PLATELET
BASO%: 0.7 % (ref 0.0–2.0)
Basophils Absolute: 0 10*3/uL (ref 0.0–0.1)
EOS%: 3.1 % (ref 0.0–7.0)
MCH: 33.6 pg — ABNORMAL HIGH (ref 27.2–33.4)
MCHC: 34 g/dL (ref 32.0–36.0)
MCV: 99.1 fL — ABNORMAL HIGH (ref 79.3–98.0)
MONO%: 10.9 % (ref 0.0–14.0)
RBC: 3.54 10*6/uL — ABNORMAL LOW (ref 4.20–5.82)
RDW: 15.1 % — ABNORMAL HIGH (ref 11.0–14.6)
nRBC: 0 % (ref 0–0)

## 2011-03-18 LAB — COMPREHENSIVE METABOLIC PANEL
AST: 25 U/L (ref 0–37)
Albumin: 3 g/dL — ABNORMAL LOW (ref 3.5–5.2)
Alkaline Phosphatase: 80 U/L (ref 39–117)
Potassium: 3.6 mEq/L (ref 3.5–5.3)
Sodium: 135 mEq/L (ref 135–145)
Total Protein: 4.9 g/dL — ABNORMAL LOW (ref 6.0–8.3)

## 2011-03-18 LAB — FERRITIN: Ferritin: 66 ng/mL (ref 22–322)

## 2011-03-18 LAB — IRON AND TIBC
%SAT: 19 % — ABNORMAL LOW (ref 20–55)
UIBC: 288 ug/dL (ref 125–400)

## 2011-03-21 ENCOUNTER — Ambulatory Visit (HOSPITAL_BASED_OUTPATIENT_CLINIC_OR_DEPARTMENT_OTHER): Payer: Medicare Other | Admitting: Physician Assistant

## 2011-03-21 ENCOUNTER — Telehealth: Payer: Self-pay | Admitting: Hematology and Oncology

## 2011-03-21 VITALS — BP 163/88 | HR 89 | Temp 97.3°F | Ht 70.5 in | Wt 176.0 lb

## 2011-03-21 DIAGNOSIS — C189 Malignant neoplasm of colon, unspecified: Secondary | ICD-10-CM

## 2011-03-21 DIAGNOSIS — C186 Malignant neoplasm of descending colon: Secondary | ICD-10-CM

## 2011-03-21 DIAGNOSIS — R5381 Other malaise: Secondary | ICD-10-CM

## 2011-03-21 DIAGNOSIS — D649 Anemia, unspecified: Secondary | ICD-10-CM

## 2011-03-21 NOTE — Progress Notes (Signed)
This office note has been dictated.

## 2011-03-21 NOTE — Progress Notes (Signed)
CC:   Mark A. Perini, M.D. Wilhemina Bonito. Marina Goodell, MD Bertram Millard. Dahlstedt, M.D.  IDENTIFYING STATEMENT:  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  He also has history of anemia.  INTERIM HISTORY:  [name] Hall reports since his last clinic visit in November 2011 he has had some fatigue, but no difficulty completing ADLs.  No fevers, chills, or night sweats.  No dyspnea or cough.  He has normal appetite and has had no problems with nausea, vomiting, constipation, or diarrhea.  No rectal bleeding.  He states he does remain under the care of [name] Hall due to some urinary incontinence. He also states that he has his next followup with his primary physician, [name] Hall next week.  He has had no problems with increased swelling of extremities and states he does use TED hose and this helps with his lower extremity swelling.  He is not having any calf tenderness.  He does report some lower extremity weakness at times. Current medications are reviewed and recorded.  PHYSICAL EXAMINATION:  Temperature is 97.3, heart rate 89, respirations 18, blood pressure 163/88, weight 176 pounds.  General:  This is a well- developed, well-nourished white male in no acute distress.  HEENT: Sclerae nonicteric.  There is no thrush or mucositis.  Skin:  No rashes or lesions.  Lymph:  No peripheral lymphadenopathy.  Cardiac:  Regular rate and rhythm without murmurs or gallops.  Peripheral pulses are 2+. Chest:  Lungs clear to auscultation.  Abdomen:  Positive bowel sounds. Soft, nontender, nondistended.  No organomegaly.  Extremities:  Without edema or cyanosis.  Neurologic:  Alert and oriented times 3.  Strength, sensation, and coordination all grossly intact.  LABORATORY DATA:  Laboratory data from March 18, 2011:  CBC with diff reveals white blood count of 3.4, hemoglobin 11.9, hematocrit 35.1, platelets  of 212, ANC of 2.1 and MCV of 99.1.  Chemistries reveal a sodium of 135, potassium 3.6, chloride 101, BUN 11, creatinine 0.88, glucose of 116, bilirubin 0.4, alkaline phosphatase 80, AST 25, ALT 12, total protein 4.9, albumin 3.0 and calcium of 8.8.  Iron studies reveal a ferritin of 66, percent saturation 19, TIBC 357, UIBC of 288 and iron level of 69.  IMPRESSION/PLAN: 1. [name] Hall is a 77 year old white male with a history of stage     IIA T2 N0 M0 (0/7 lymph nodes positive) adenocarcinoma of the     descending colon.  He is status post transverse sigmoid colectomy     in February of 2003 followed by 3 months of adjuvant chemotherapy     with 5-FU with no evidence of disease recurrence since that time.     Last colonoscopy in October 2011 revealed no evidence of     malignancy. 2. Patient with history of anemia.  He is currently on oral B12     injections and has had overall stable hemoglobin.  He has his next     followup appointment with his primary physician next week. 3. Per Dr. Dalene Carrow, the patient will be scheduled for followup visit in     1 year's time.  A few days before this, we     will reassess CBC with diff, CMET, CEA, ferritin, iron IBC.  The     patient is advised to call in the interim if any questions or     problems.  ______________________________ Sherilyn Banker, MSN, ANP, BC RJ/MEDQ  D:  03/21/2011  T:  03/21/2011  Job:  [id number]

## 2011-03-21 NOTE — Telephone Encounter (Signed)
gv pt appt schedule for nov 2013

## 2011-03-26 ENCOUNTER — Other Ambulatory Visit: Payer: Medicare Other | Admitting: Lab

## 2012-02-13 ENCOUNTER — Encounter: Payer: Self-pay | Admitting: Internal Medicine

## 2012-03-10 ENCOUNTER — Telehealth: Payer: Self-pay | Admitting: Hematology and Oncology

## 2012-03-10 NOTE — Telephone Encounter (Signed)
Pt called to cx 11/15 appt. Per pt he is not going to r/s at this time. If needed he will call back to r/s. LO aware and per LO ok to cx.

## 2012-03-20 ENCOUNTER — Other Ambulatory Visit: Payer: Medicare Other | Admitting: Lab

## 2012-03-20 ENCOUNTER — Ambulatory Visit: Payer: Medicare Other | Admitting: Hematology and Oncology

## 2012-08-07 ENCOUNTER — Encounter: Payer: Self-pay | Admitting: Internal Medicine

## 2012-09-09 ENCOUNTER — Ambulatory Visit (AMBULATORY_SURGERY_CENTER): Payer: Medicare Other | Admitting: *Deleted

## 2012-09-09 VITALS — Ht 71.0 in | Wt 183.2 lb

## 2012-09-09 DIAGNOSIS — Z1211 Encounter for screening for malignant neoplasm of colon: Secondary | ICD-10-CM

## 2012-09-09 MED ORDER — MOVIPREP 100 G PO SOLR: ORAL | Status: DC

## 2012-09-23 ENCOUNTER — Ambulatory Visit (AMBULATORY_SURGERY_CENTER): Payer: Medicare Other | Admitting: Internal Medicine

## 2012-09-23 ENCOUNTER — Encounter: Payer: Self-pay | Admitting: Internal Medicine

## 2012-09-23 VITALS — BP 158/95 | HR 85 | Temp 97.9°F | Resp 20 | Ht 71.0 in | Wt 183.0 lb

## 2012-09-23 DIAGNOSIS — Z1211 Encounter for screening for malignant neoplasm of colon: Secondary | ICD-10-CM

## 2012-09-23 DIAGNOSIS — D126 Benign neoplasm of colon, unspecified: Secondary | ICD-10-CM

## 2012-09-23 DIAGNOSIS — Z86010 Personal history of colonic polyps: Secondary | ICD-10-CM

## 2012-09-23 MED ORDER — SODIUM CHLORIDE 0.9 % IV SOLN: 500.0000 mL | INTRAVENOUS | Status: DC

## 2012-09-23 NOTE — Progress Notes (Signed)
Called to room to assist during endoscopic procedure.  Patient ID and intended procedure confirmed with present staff. Received instructions for my participation in the procedure from the performing physician. ewm 

## 2012-09-23 NOTE — Patient Instructions (Signed)
YOU HAD AN ENDOSCOPIC PROCEDURE TODAY AT THE [facility]: Refer to the procedure report that was given to you for any specific questions about what was found during the examination.  If the procedure report does not answer your questions, please call your gastroenterologist to clarify.  If you requested that your care partner not be given the details of your procedure findings, then the procedure report has been included in a sealed envelope for you to review at your convenience later.  YOU SHOULD EXPECT: Some feelings of bloating in the abdomen. Passage of more gas than usual.  Walking can help get rid of the air that was put into your GI tract during the procedure and reduce the bloating. If you had a lower endoscopy (such as a colonoscopy or flexible sigmoidoscopy) you may notice spotting of blood in your stool or on the toilet paper. If you underwent a bowel prep for your procedure, then you may not have a normal bowel movement for a few days.  DIET: Your first meal following the procedure should be a light meal and then it is ok to progress to your normal diet.  A half-sandwich or bowl of soup is an example of a good first meal.  Heavy or fried foods are harder to digest and may make you feel nauseous or bloated.  Likewise meals heavy in dairy and vegetables can cause extra gas to form and this can also increase the bloating.  Drink plenty of fluids but you should avoid alcoholic beverages for 24 hours.  ACTIVITY: Your care partner should take you home directly after the procedure.  You should plan to take it easy, moving slowly for the rest of the day.  You can resume normal activity the day after the procedure however you should NOT DRIVE or use heavy machinery for 24 hours (because of the sedation medicines used during the test).    SYMPTOMS TO REPORT IMMEDIATELY: A gastroenterologist can be reached at any hour.  During normal business hours, 8:30 AM to 5:00 PM Monday through Friday,  call [phone].  After hours and on weekends, please call the GI answering service at [phone] who will take a message and have the physician on call contact you.   Following lower endoscopy (colonoscopy or flexible sigmoidoscopy):  Excessive amounts of blood in the stool  Significant tenderness or worsening of abdominal pains  Swelling of the abdomen that is new, acute  Fever of 100F or higher    FOLLOW UP: If any biopsies were taken you will be contacted by phone or by letter within the next 1-3 weeks.  Call your gastroenterologist if you have not heard about the biopsies in 3 weeks.  Our staff will call the home number listed on your records the next business day following your procedure to check on you and address any questions or concerns that you may have at that time regarding the information given to you following your procedure. This is a courtesy call and so if there is no answer at the home number and we have not heard from you through the emergency physician on call, we will assume that you have returned to your regular daily activities without incident.  SIGNATURES/CONFIDENTIALITY: You and/or your care partner have signed paperwork which will be entered into your electronic medical record.  These signatures attest to the fact that that the information above on your After Visit Summary has been reviewed and is understood.  Full responsibility of the confidentiality   of this discharge information lies with you and/or your care-partner.     

## 2012-09-23 NOTE — Progress Notes (Signed)
Stable to RR 

## 2012-09-23 NOTE — Progress Notes (Signed)
Patient did not experience any of the following events: a burn prior to discharge; a fall within the facility; wrong site/side/patient/procedure/implant event; or a hospital transfer or hospital admission upon discharge from the facility. (G8907) Patient did not have preoperative order for IV antibiotic SSI prophylaxis. (G8918)  

## 2012-09-23 NOTE — Op Note (Signed)
[facility] [address] [place], [address]   COLONOSCOPY PROCEDURE REPORT  PATIENT: [name], Hall  MR#: [id number] BIRTHDATE: [birth year] , 79  yrs. old GENDER: Male ENDOSCOPIST: Roxy Cedar, MD REFERRED XB:[id number] Program Recall PROCEDURE DATE:  09/23/2012 PROCEDURE:   Colonoscopy with snare polypectomy    x 3 ASA CLASS:   Class II INDICATIONS:High risk patient with personal history of colon cancer (2003)and Patient's personal history of adenomatous colon polyps. MUTIPLE, PIECEMEAL 2003,04,05,09,11 MEDICATIONS: MAC sedation, administered by CRNA and propofol (Diprivan) 160mg  IV  DESCRIPTION OF PROCEDURE:   After the risks benefits and alternatives of the procedure were thoroughly explained, informed consent was obtained.  A digital rectal exam revealed no abnormalities of the rectum.   The LB [id number]  endoscope was introduced through the anus and advanced to the cecum, which was identified by both the appendix and ileocecal valve. No adverse events experienced.   The quality of the prep was excellent, using MoviPrep  The instrument was then slowly withdrawn as the colon was fully examined.      COLON FINDINGS: Three polyps ranging between 3-71mm in size were found in the ascending colon and transverse colon.  A polypectomy was performed with a cold snare.  The resection was complete and the polyp tissue was completely retrieved.   Moderate diverticulosis was noted throughout the entire examined colon. There was evidence of a prior colo-colonic surgical anastomosis in the left colon.   The colon mucosa was otherwise normal. Retroflexed views revealed no abnormalities. The time to cecum=1 minutes 0 seconds.  Withdrawal time=11 minutes 15 seconds.  The scope was withdrawn and the procedure completed. COMPLICATIONS: There were no complications.  ENDOSCOPIC IMPRESSION: 1.   Three polyps ranging  in the ascending colon and  transverse colon; polypectomy was performed with a cold snare 2.   Moderate diverticulosis was noted throughout the entire examined colon 3.   There was evidence of a prior colo-colonic surgical anastomosis in the left colon 4.   The colon mucosa was otherwise normal  RECOMMENDATIONS: 1. No further surveillance recommended. Return to the care of your primary provider.  GI follow up as needed   eSigned:  Roxy Cedar, MD 09/23/2012 11:51 AM cc: Rodrigo Ran, MD and The Patient   PATIENT NAME:  [name], Hall MR#: [id number]

## 2012-09-24 ENCOUNTER — Telehealth: Payer: Self-pay | Admitting: *Deleted

## 2012-09-24 NOTE — Telephone Encounter (Signed)
  Follow up Call-  Call back number 09/23/2012  Post procedure Call Back phone  # [phone]  Permission to leave phone message Yes     Patient questions:  Do you have a fever, pain , or abdominal swelling? no Pain Score  0 *  Have you tolerated food without any problems? yes  Have you been able to return to your normal activities? yes  Do you have any questions about your discharge instructions: Diet   no Medications  no Follow up visit  no  Do you have questions or concerns about your Care? no  Actions: * If pain score is 4 or above: No action needed, pain <4.

## 2012-09-29 ENCOUNTER — Encounter: Payer: Self-pay | Admitting: Internal Medicine

## 2014-09-04 HISTORY — PX: CARPAL TUNNEL RELEASE: SHX101

## 2014-09-20 ENCOUNTER — Encounter: Payer: Self-pay | Admitting: Internal Medicine

## 2014-10-28 ENCOUNTER — Encounter (HOSPITAL_COMMUNITY): Payer: Self-pay | Admitting: Emergency Medicine

## 2014-10-28 ENCOUNTER — Inpatient Hospital Stay (HOSPITAL_COMMUNITY): Payer: Medicare Other

## 2014-10-28 ENCOUNTER — Inpatient Hospital Stay (HOSPITAL_COMMUNITY)
Admission: EM | Admit: 2014-10-28 | Discharge: 2014-10-31 | DRG: 055 | Disposition: A | Discharge status: Other Acute Inpatient Hospital | Payer: Medicare Other | Attending: Internal Medicine | Admitting: Internal Medicine

## 2014-10-28 DIAGNOSIS — Z79899 Other long term (current) drug therapy: Secondary | ICD-10-CM | POA: Diagnosis not present

## 2014-10-28 DIAGNOSIS — L03116 Cellulitis of left lower limb: Secondary | ICD-10-CM | POA: Diagnosis not present

## 2014-10-28 DIAGNOSIS — R531 Weakness: Secondary | ICD-10-CM | POA: Diagnosis present

## 2014-10-28 DIAGNOSIS — G9520 Unspecified cord compression: Secondary | ICD-10-CM | POA: Diagnosis present

## 2014-10-28 DIAGNOSIS — H409 Unspecified glaucoma: Secondary | ICD-10-CM | POA: Diagnosis not present

## 2014-10-28 DIAGNOSIS — R748 Abnormal levels of other serum enzymes: Secondary | ICD-10-CM | POA: Insufficient documentation

## 2014-10-28 DIAGNOSIS — I82409 Acute embolism and thrombosis of unspecified deep veins of unspecified lower extremity: Secondary | ICD-10-CM

## 2014-10-28 DIAGNOSIS — E039 Hypothyroidism, unspecified: Secondary | ICD-10-CM | POA: Diagnosis not present

## 2014-10-28 DIAGNOSIS — W19XXXA Unspecified fall, initial encounter: Secondary | ICD-10-CM

## 2014-10-28 DIAGNOSIS — L0390 Cellulitis, unspecified: Secondary | ICD-10-CM

## 2014-10-28 DIAGNOSIS — M6282 Rhabdomyolysis: Secondary | ICD-10-CM | POA: Diagnosis present

## 2014-10-28 DIAGNOSIS — N400 Enlarged prostate without lower urinary tract symptoms: Secondary | ICD-10-CM | POA: Diagnosis present

## 2014-10-28 DIAGNOSIS — Z7982 Long term (current) use of aspirin: Secondary | ICD-10-CM | POA: Diagnosis not present

## 2014-10-28 DIAGNOSIS — I509 Heart failure, unspecified: Secondary | ICD-10-CM | POA: Diagnosis not present

## 2014-10-28 DIAGNOSIS — D320 Benign neoplasm of cerebral meninges: Principal | ICD-10-CM | POA: Diagnosis present

## 2014-10-28 DIAGNOSIS — D649 Anemia, unspecified: Secondary | ICD-10-CM | POA: Diagnosis not present

## 2014-10-28 DIAGNOSIS — Z87891 Personal history of nicotine dependence: Secondary | ICD-10-CM

## 2014-10-28 DIAGNOSIS — Z85038 Personal history of other malignant neoplasm of large intestine: Secondary | ICD-10-CM | POA: Diagnosis not present

## 2014-10-28 DIAGNOSIS — I4891 Unspecified atrial fibrillation: Secondary | ICD-10-CM | POA: Diagnosis not present

## 2014-10-28 DIAGNOSIS — Y92009 Unspecified place in unspecified non-institutional (private) residence as the place of occurrence of the external cause: Secondary | ICD-10-CM

## 2014-10-28 DIAGNOSIS — R609 Edema, unspecified: Secondary | ICD-10-CM

## 2014-10-28 DIAGNOSIS — F419 Anxiety disorder, unspecified: Secondary | ICD-10-CM | POA: Diagnosis not present

## 2014-10-28 DIAGNOSIS — Z66 Do not resuscitate: Secondary | ICD-10-CM | POA: Diagnosis present

## 2014-10-28 DIAGNOSIS — Y92099 Unspecified place in other non-institutional residence as the place of occurrence of the external cause: Secondary | ICD-10-CM

## 2014-10-28 HISTORY — DX: Other complications of anesthesia, initial encounter: T88.59XA

## 2014-10-28 HISTORY — DX: Adverse effect of unspecified anesthetic, initial encounter: T41.45XA

## 2014-10-28 LAB — URINALYSIS, ROUTINE W REFLEX MICROSCOPIC
Bilirubin Urine: NEGATIVE
GLUCOSE, UA: NEGATIVE mg/dL
HGB URINE DIPSTICK: NEGATIVE
Ketones, ur: 40 mg/dL — AB
LEUKOCYTES UA: NEGATIVE
Nitrite: NEGATIVE
PROTEIN: NEGATIVE mg/dL
Specific Gravity, Urine: 1.019 (ref 1.005–1.030)
Urobilinogen, UA: 1 mg/dL (ref 0.0–1.0)
pH: 7.5 (ref 5.0–8.0)

## 2014-10-28 LAB — I-STAT TROPONIN, ED: Troponin i, poc: 0.01 ng/mL (ref 0.00–0.08)

## 2014-10-28 LAB — CBC
HEMATOCRIT: 34.5 % — AB (ref 39.0–52.0)
HEMOGLOBIN: 11.7 g/dL — AB (ref 13.0–17.0)
MCH: 33.9 pg (ref 26.0–34.0)
MCHC: 33.9 g/dL (ref 30.0–36.0)
MCV: 100 fL (ref 78.0–100.0)
Platelets: 172 10*3/uL (ref 150–400)
RBC: 3.45 MIL/uL — AB (ref 4.22–5.81)
RDW: 14.2 % (ref 11.5–15.5)
WBC: 5 10*3/uL (ref 4.0–10.5)

## 2014-10-28 LAB — CBC WITH DIFFERENTIAL/PLATELET
BASOS PCT: 0 % (ref 0–1)
Basophils Absolute: 0 10*3/uL (ref 0.0–0.1)
Eosinophils Absolute: 0 10*3/uL (ref 0.0–0.7)
Eosinophils Relative: 0 % (ref 0–5)
HCT: 34.3 % — ABNORMAL LOW (ref 39.0–52.0)
HEMOGLOBIN: 11.6 g/dL — AB (ref 13.0–17.0)
LYMPHS ABS: 0.7 10*3/uL (ref 0.7–4.0)
Lymphocytes Relative: 9 % — ABNORMAL LOW (ref 12–46)
MCH: 33.7 pg (ref 26.0–34.0)
MCHC: 33.8 g/dL (ref 30.0–36.0)
MCV: 99.7 fL (ref 78.0–100.0)
Monocytes Absolute: 0.5 10*3/uL (ref 0.1–1.0)
Monocytes Relative: 7 % (ref 3–12)
NEUTROS PCT: 84 % — AB (ref 43–77)
Neutro Abs: 6.2 10*3/uL (ref 1.7–7.7)
Platelets: 187 10*3/uL (ref 150–400)
RBC: 3.44 MIL/uL — AB (ref 4.22–5.81)
RDW: 14.1 % (ref 11.5–15.5)
WBC: 7.5 10*3/uL (ref 4.0–10.5)

## 2014-10-28 LAB — COMPREHENSIVE METABOLIC PANEL
ALT: 20 U/L (ref 17–63)
AST: 46 U/L — ABNORMAL HIGH (ref 15–41)
Albumin: 2.7 g/dL — ABNORMAL LOW (ref 3.5–5.0)
Alkaline Phosphatase: 69 U/L (ref 38–126)
Anion gap: 7 (ref 5–15)
BUN: 14 mg/dL (ref 6–20)
CALCIUM: 7.9 mg/dL — AB (ref 8.9–10.3)
CO2: 26 mmol/L (ref 22–32)
CREATININE: 0.58 mg/dL — AB (ref 0.61–1.24)
Chloride: 101 mmol/L (ref 101–111)
GFR calc Af Amer: >60 mL/min (ref >60)
GFR calc non Af Amer: >60 mL/min (ref >60)
GLUCOSE: 110 mg/dL — AB (ref 65–99)
Potassium: 3.4 mmol/L — ABNORMAL LOW (ref 3.5–5.1)
SODIUM: 134 mmol/L — AB (ref 135–145)
TOTAL PROTEIN: 4.7 g/dL — AB (ref 6.5–8.1)
Total Bilirubin: 1.4 mg/dL — ABNORMAL HIGH (ref 0.3–1.2)

## 2014-10-28 LAB — TSH: TSH: 1.015 u[IU]/mL (ref 0.350–4.500)

## 2014-10-28 LAB — SEDIMENTATION RATE: Sed Rate: 9 mm/hr (ref 0–16)

## 2014-10-28 LAB — I-STAT CG4 LACTIC ACID, ED: LACTIC ACID, VENOUS: 0.58 mmol/L (ref 0.5–2.0)

## 2014-10-28 LAB — CREATININE, SERUM
Creatinine, Ser: 0.51 mg/dL — ABNORMAL LOW (ref 0.61–1.24)
GFR calc Af Amer: >60 mL/min (ref >60)

## 2014-10-28 LAB — CK: CK TOTAL: 960 U/L — AB (ref 49–397)

## 2014-10-28 MED ORDER — ONDANSETRON HCL 4 MG PO TABS: 4.0000 mg | ORAL_TABLET | Freq: Four times a day (QID) | ORAL | Status: DC | PRN

## 2014-10-28 MED ORDER — VANCOMYCIN HCL IN DEXTROSE 750-5 MG/150ML-% IV SOLN
750.0000 mg | Freq: Two times a day (BID) | INTRAVENOUS | Status: DC
  Administered 2014-10-28 – 2014-10-31 (×7): 750 mg via INTRAVENOUS
  Filled 2014-10-28 (×8): qty 150

## 2014-10-28 MED ORDER — SODIUM CHLORIDE 0.9 % IJ SOLN: 3.0000 mL | INTRAMUSCULAR | Status: DC | PRN

## 2014-10-28 MED ORDER — CYCLOSPORINE 0.05 % OP EMUL
1.0000 [drp] | Freq: Two times a day (BID) | OPHTHALMIC | Status: DC
  Administered 2014-10-29 – 2014-10-31 (×2): 1 [drp] via OPHTHALMIC
  Filled 2014-10-28 (×9): qty 1

## 2014-10-28 MED ORDER — ASPIRIN EC 81 MG PO TBEC
81.0000 mg | DELAYED_RELEASE_TABLET | Freq: Every day | ORAL | Status: DC
  Administered 2014-10-28 – 2014-10-31 (×3): 81 mg via ORAL
  Filled 2014-10-28 (×8): qty 1

## 2014-10-28 MED ORDER — LEVOTHYROXINE SODIUM 100 MCG PO TABS
100.0000 ug | ORAL_TABLET | Freq: Every day | ORAL | Status: DC
Start: 2014-10-29 — End: 2014-10-31
  Administered 2014-10-29 – 2014-10-31 (×3): 100 ug via ORAL
  Filled 2014-10-28 (×3): qty 1

## 2014-10-28 MED ORDER — SODIUM CHLORIDE 0.9 % IV SOLN
INTRAVENOUS | Status: DC
  Administered 2014-10-28 – 2014-10-30 (×4): via INTRAVENOUS

## 2014-10-28 MED ORDER — ACETAMINOPHEN 325 MG PO TABS
650.0000 mg | ORAL_TABLET | Freq: Four times a day (QID) | ORAL | Status: DC | PRN
  Administered 2014-10-28 – 2014-10-31 (×8): 650 mg via ORAL
  Filled 2014-10-28 (×8): qty 2

## 2014-10-28 MED ORDER — SODIUM CHLORIDE 0.9 % IV SOLN
INTRAVENOUS | Status: AC
  Administered 2014-10-28: 12:00:00 via INTRAVENOUS

## 2014-10-28 MED ORDER — SODIUM CHLORIDE 0.9 % IJ SOLN
3.0000 mL | Freq: Two times a day (BID) | INTRAMUSCULAR | Status: DC
  Administered 2014-10-28 – 2014-10-30 (×3): 3 mL via INTRAVENOUS

## 2014-10-28 MED ORDER — SALINE SPRAY 0.65 % NA SOLN
1.0000 | NASAL | Status: DC | PRN
  Administered 2014-10-29: 1 via NASAL
  Filled 2014-10-28: qty 44

## 2014-10-28 MED ORDER — SODIUM CHLORIDE 0.9 % IV BOLUS (SEPSIS)
1000.0000 mL | Freq: Once | INTRAVENOUS | Status: AC
  Administered 2014-10-28: 1000 mL via INTRAVENOUS

## 2014-10-28 MED ORDER — CYCLOSPORINE 0.05 % OP EMUL
1.0000 [drp] | Freq: Two times a day (BID) | OPHTHALMIC | Status: DC
  Filled 2014-10-28 (×2): qty 1

## 2014-10-28 MED ORDER — BRIMONIDINE TARTRATE 0.15 % OP SOLN: 1.0000 [drp] | Freq: Two times a day (BID) | OPHTHALMIC | Status: DC

## 2014-10-28 MED ORDER — DORZOLAMIDE HCL-TIMOLOL MAL 2-0.5 % OP SOLN
1.0000 [drp] | Freq: Two times a day (BID) | OPHTHALMIC | Status: DC
  Administered 2014-10-29 – 2014-10-30 (×4): 1 [drp] via OPHTHALMIC
  Filled 2014-10-28 (×2): qty 10

## 2014-10-28 MED ORDER — LATANOPROST 0.005 % OP SOLN
1.0000 [drp] | Freq: Every day | OPHTHALMIC | Status: DC
  Filled 2014-10-28: qty 2.5

## 2014-10-28 MED ORDER — ENOXAPARIN SODIUM 40 MG/0.4ML ~~LOC~~ SOLN
40.0000 mg | SUBCUTANEOUS | Status: DC
  Administered 2014-10-28: 40 mg via SUBCUTANEOUS
  Filled 2014-10-28: qty 0.4

## 2014-10-28 MED ORDER — ACETAMINOPHEN 650 MG RE SUPP: 650.0000 mg | Freq: Four times a day (QID) | RECTAL | Status: DC | PRN

## 2014-10-28 MED ORDER — ONDANSETRON HCL 4 MG/2ML IJ SOLN: 4.0000 mg | Freq: Four times a day (QID) | INTRAMUSCULAR | Status: DC | PRN

## 2014-10-28 MED ORDER — DORZOLAMIDE HCL-TIMOLOL MAL 2-0.5 % OP SOLN
1.0000 [drp] | Freq: Two times a day (BID) | OPHTHALMIC | Status: DC
  Filled 2014-10-28: qty 10

## 2014-10-28 MED ORDER — POTASSIUM CHLORIDE CRYS ER 20 MEQ PO TBCR
40.0000 meq | EXTENDED_RELEASE_TABLET | Freq: Once | ORAL | Status: AC
  Administered 2014-10-28: 40 meq via ORAL
  Filled 2014-10-28: qty 2

## 2014-10-28 MED ORDER — SODIUM CHLORIDE 0.9 % IV SOLN: 250.0000 mL | INTRAVENOUS | Status: DC | PRN

## 2014-10-28 NOTE — ED Notes (Signed)
Dr Otter at bedside  

## 2014-10-28 NOTE — Progress Notes (Addendum)
ANTIBIOTIC CONSULT NOTE - INITIAL  Pharmacy Consult for vancomycin Indication: cellulitis  Allergies  Allergen Reactions  . Other Swelling    neptazine eye gtts  . Terbinafine And Related Swelling  . Codeine     REACTION: headache  . Sulfonamide Derivatives     REACTION: rash    Patient Measurements: Height: 5\' 10"  (177.8 cm) Weight: 165 lb 2 oz (74.9 kg) IBW/kg (Calculated) : 73   Vital Signs: Temp: 97.2 F (36.2 C) (06/24 0947) Temp Source: Oral (06/24 0742) BP: 139/54 mmHg (06/24 1000) Pulse Rate: 90 (06/24 1000) Intake/Output from previous day:   Intake/Output from this shift: Total I/O In: 1000 [I.V.:1000] Out: -   Labs:  Recent Labs  10/28/14 0543 10/28/14 1143  WBC 7.5 5.0  HGB 11.6* 11.7*  PLT 187 172  CREATININE 0.58* 0.51*   Estimated Creatinine Clearance: 77.3 mL/min (by C-G formula based on Cr of 0.51). No results for input(s): VANCOTROUGH, VANCOPEAK, VANCORANDOM, GENTTROUGH, GENTPEAK, GENTRANDOM, TOBRATROUGH, TOBRAPEAK, TOBRARND, AMIKACINPEAK, AMIKACINTROU, AMIKACIN in the last 72 hours.   Microbiology: Recent Results (from the past 720 hour(s))  Culture, blood (routine x 2)     Status: None (Preliminary result)   Collection Time: 10/28/14 11:40 AM  Result Value Ref Range Status   Specimen Description BLOOD RIGHT ANTECUBITAL  Final   Special Requests BOTTLES DRAWN AEROBIC AND ANAEROBIC 5CC  Final   Culture PENDING  Incomplete   Report Status PENDING  Incomplete    Medical History: Past Medical History  Diagnosis Date  . Glaucoma   . Allergy   . Anemia   . Anxiety   . Hypothyroidism   . Colon cancer 2003  . Complication of anesthesia     "had trouble waking me up after colonoscopy once"    Assessment: 47 YOM who presents after falling at home with generalized weakness. Also has LLE erythema and warmth. Duplex completed and did not show DVT. To start vancomycin for cellulitis. WBC 5, afebrile. SCr 0.5 with est CrCl  ~75-39mL/min.  Goal of Therapy:  Vancomycin trough level 10-15 mcg/ml  Plan:  -vancomycin 750mg  IV q12h -follow renal function, c/s, clinical progression, trough PRN -if uncomplicated cellulitis, recommend PO doxycyline (patient has allergy to sulfonamides, so Bactrim would not be appropriate)  Siyah Mault D. Ulises Wolfinger, PharmD, BCPS Clinical Pharmacist Pager: [phone] 10/28/2014 1:18 PM

## 2014-10-28 NOTE — H&P (Signed)
PCP:   Jerlyn Ly, MD   Chief Complaint:  Weakness  HPI: 81 year old male who   has a past medical history of Glaucoma; Allergy; Anemia; Anxiety; Colon cancer (2003); and Hypothyroidism. Today was brought to the ED by EMS after a fall. Patient says that he was sitting in his computer room when he stood up to go out of the room and he may have had a misstep and then down on the floor on his buttocks. He denies loss of consciousness. Did not hit his head patient says that he could not get up from the sitting position until around 3 AM when he called a friend and called 911. Patient had carpal tunnel surgery and left hand on 09/28/2014 and was prescribed tramadol when necessary for pain which she has been taking occasionally. Patient says that he has been unsteady on his gait over the past few weeks, denies any dysarthria, no nausea vomiting or diarrhea. No chest pain shortness of breath. No history of seizures. No recent fever. Patient does have history of BPH and is followed by urology. In the ED patient found to have elevated CK of 960.    Allergies:   Allergies  Allergen Reactions  . Other Swelling    neptazine eye gtts  . Terbinafine And Related Swelling  . Codeine     REACTION: headache  . Sulfonamide Derivatives     REACTION: rash      Past Medical History  Diagnosis Date  . Glaucoma   . Allergy   . Anemia   . Anxiety   . Colon cancer 2003  . Hypothyroidism     Past Surgical History  Procedure Laterality Date  . Cataract extraction w/ intraocular lens  implant, bilateral  2005  . Tear duct probing      with tubes    Prior to Admission medications   Medication Sig Start Date End Date Taking? Authorizing Provider  aspirin 81 MG tablet Take 81 mg by mouth. Takes once a week    Historical Provider, MD  benzonatate (TESSALON) 100 MG capsule Take 100 mg by mouth 3 (three) times daily as needed for cough.    Historical Provider, MD  brimonidine (ALPHAGAN P) 0.1 %  SOLN 1 drop 2 (two) times daily.    Historical Provider, MD  calcium carbonate (OS-CAL) 600 MG TABS Take 600 mg by mouth 2 (two) times daily with a meal.      Historical Provider, MD  cholecalciferol (VITAMIN D) 1000 UNITS tablet Take 1,000 Units by mouth daily.    Historical Provider, MD  cyanocobalamin 500 MCG tablet Take 500 mcg by mouth daily.      Historical Provider, MD  CycloSPORINE (RESTASIS OP) Apply to eye 2 (two) times daily.    Historical Provider, MD  docusate sodium (COLACE) 100 MG capsule Take 100 mg by mouth 2 (two) times daily.      Historical Provider, MD  dorzolamide-timolol (COSOPT) 22.3-6.8 MG/ML ophthalmic solution 1 drop 2 (two) times daily.    Historical Provider, MD  famciclovir [facility]) 500 MG tablet  01/04/11   Historical Provider, MD  fenofibrate 160 MG tablet  02/08/11   Historical Provider, MD  finasteride (PROSCAR) 5 MG tablet  12/24/10   Historical Provider, MD  ibandronate (BONIVA) 150 MG tablet TAKE ONE TAB A MONTH 10/14/14   Historical Provider, MD  ipratropium (ATROVENT) 0.03 % nasal spray Place 2 sprays into the nose every 12 (twelve) hours.    Historical Provider, MD  latanoprost (XALATAN) 0.005 % ophthalmic solution Place 1 drop into the left eye daily.    Historical Provider, MD  levothyroxine (SYNTHROID, LEVOTHROID) 100 MCG tablet Take 100 mcg by mouth daily before breakfast.    Historical Provider, MD  LORazepam (ATIVAN) 1 MG tablet Take by mouth every 8 (eight) hours.  02/12/11   Historical Provider, MD  Multiple Vitamin (MULTIVITAMIN) tablet Take 1 tablet by mouth daily.      Historical Provider, MD  oxybutynin (DITROPAN) 5 MG tablet Take 5 mg by mouth 3 (three) times daily.      Historical Provider, MD  traMADol (ULTRAM) 50 MG tablet TAKE 1 TABLET BY MOUTH EVERY 6 TO 8 HOURS AS NEEDED FOR PAIN 09/28/14   Historical Provider, MD  UNABLE TO FIND Med Name: Vitamin b 12 injection once a month    Historical Provider, MD  Vitamin B1-B12 5.5-0.0125 MG/5ML SOLN Take  by mouth.    Historical Provider, MD    Social History:  reports that he quit smoking about 42 years ago. He has never used smokeless tobacco. He reports that he does not drink alcohol or use illicit drugs.  Family History  Problem Relation Age of Onset  . Family history unknown: Yes    Filed Weights   10/28/14 0514  Weight: 77.111 kg (170 lb)    Review of Systems:  As in the history of present illness, rest of the review of systems negative   Physical Exam: Blood pressure 150/64, pulse 102, temperature 97 F (36.1 C), temperature source Oral, resp. rate 20, height 5\' 10"  (1.778 m), weight 77.111 kg (170 lb), SpO2 99 %. Constitutional:   Patient is a well-developed and well-nourished male* in no acute distress and cooperative with exam. Head: Normocephalic and atraumatic Mouth: Mucus membranes moist Eyes: PERRL, EOMI, conjunctivae normal Neck: Supple, No Thyromegaly Cardiovascular: RRR, S1 normal, S2 normal Pulmonary/Chest: CTAB, no wheezes, rales, or rhonchi Abdominal: Soft. Non-tender, non-distended, bowel sounds are normal, no masses, organomegaly, or guarding present.  Neurological: A&O x3, Strength is normal and symmetric bilaterally, cranial nerve II-XII are grossly intact, no focal motor deficit, sensory intact to light touch bilaterally.  Extremities : Left lower extremity erythematous, warm to touch, left calf is tender to palpation   Labs on Admission:  Basic Metabolic Panel:  Recent Labs Lab 10/28/14 0543  NA 134*  K 3.4*  CL 101  CO2 26  GLUCOSE 110*  BUN 14  CREATININE 0.58*  CALCIUM 7.9*   Liver Function Tests:  Recent Labs Lab 10/28/14 0543  AST 46*  ALT 20  ALKPHOS 69  BILITOT 1.4*  PROT 4.7*  ALBUMIN 2.7*   CBC:  Recent Labs Lab 10/28/14 0543  WBC 7.5  NEUTROABS 6.2  HGB 11.6*  HCT 34.3*  MCV 99.7  PLT 187   Cardiac Enzymes:  Recent Labs Lab 10/28/14 0543  CKTOTAL 960*    Radiological Exams on Admission: Dg Pelvis 1-2  Views  10/28/2014   CLINICAL DATA:  Pain following fall  EXAM: PELVIS - 1-2 VIEW  COMPARISON:  None.  FINDINGS: There is no evidence of pelvic fracture or dislocation. There is mild symmetric narrowing of both hip joints. There are foci of arterial vascular calcification.  IMPRESSION: Symmetric narrowing of both hip joints. No acute fracture or dislocation.   Electronically Signed   By: Lowella Grip III M.D.   On: 10/28/2014 07:56    EKG: Independently reviewed. Sinus tachycardia   Assessment/Plan Active Problems:   Weakness  Cellulitis  Generalized weakness Patient presenting with generalized weakness, mild elevation of CK. X-ray of the pelvis was done which is negative for any acute fracture. Will start the patient on gentle IV hydration. Follow CK in the morning. Once patient stable consider PT OT evaluation. Will also obtain CT head.  Cellulitis Patient has left lower extremity erythema and warmth, tenderness to palpation. Will check ultrasound of the lower extremities to rule out DVT. Also start vancomycin per pharmacy consultation for possible cellulitis.  ? A. Fib Patient's cardiac monitor in the ED shows possible A. fib previous EKG showed sinus tachycardia with PVCs, will repeat EKG.  DVT prophylaxis SCDs  Code status: DO NOT RESUSCITATE  Family discussion: No family at bedside, discussed with patient in detail.   Time Spent on Admission: 60 minutes  [facility] Pager: [phone] 10/28/2014, 9:30 AM  If 7PM-7AM, please contact night-coverage  [email or web address]  Password [email or web address]  ;

## 2014-10-28 NOTE — ED Notes (Signed)
Pt has returned from CT and will be heading upstairs to 6N

## 2014-10-28 NOTE — ED Notes (Addendum)
EMS - Patient coming from home after falling around 8pm last night and has been trying to get up on his on since.  Finally called EMS around 03:30 for assistance.  C/o of weakness.  Denies hitting his head.  States his legs got weak and he fell.  Incontinent.  Patient lives at home alone.

## 2014-10-28 NOTE — Progress Notes (Signed)
*  PRELIMINARY RESULTS* Vascular Ultrasound Lower extremity venous duplex has been completed.  Preliminary findings: negative for DVT. 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  Landry Mellow, RDMS, RVT  10/28/2014, 12:54 PM

## 2014-10-28 NOTE — Progress Notes (Signed)
Got a call from radiologist this morning, that CT head may show cavernous angioma and possible dural thickening versus hemorrhage. MRI of the brain and cervical spine ordered as per radiologist's recommendation. We will follow the results. Will hold Lovenox for DVT prophylaxis. Start SCDs.

## 2014-10-28 NOTE — ED Provider Notes (Signed)
CSN: [id number]     Arrival date & time 10/28/14  0457 History   First MD Initiated Contact with Patient 10/28/14 [phone]     Chief Complaint  Patient presents with  . Fall     (Consider location/radiation/quality/duration/timing/severity/associated sxs/prior Treatment) HPI 81 year old male presents to emergency department via EMS from home after a fall.  Patient reports that he lost his footing and sat down in his computer room.  Asian fell around 8:00.  He reports that he was unable to get up from a seated position.  He did not strike his head, did not have loss of consciousness.  Patient reports he struggled until around 3 AM when he called a friend who then called 911.  Patient complaining of tightness in his bilateral thighs and arms.  Patient did have swelling of both arms, left greater than right.  He reports he had surgery to his carpal tunnel about a month ago, but does not remember having swelling like tonight.  Patient lives by himself, wife died a year ago in 2022/08/08.  Patient was incontinent when EMS arrived.  Patient reports that he has been having increasing weakness over the last several months, usually uses a cane. Past Medical History  Diagnosis Date  . Glaucoma   . Allergy   . Anemia   . Anxiety   . Colon cancer 08/07/01  . Hypothyroidism    Past Surgical History  Procedure Laterality Date  . Cataract extraction w/ intraocular lens  implant, bilateral  08-08-03  . Tear duct probing      with tubes   Family History  Problem Relation Age of Onset  . Family history unknown: Yes   History  Substance Use Topics  . Smoking status: Former Smoker    Quit date: 05/06/1972  . Smokeless tobacco: Never Used  . Alcohol Use: No    Review of Systems  See History of Present Illness; otherwise all other systems are reviewed and negative   Allergies  Other; Terbinafine and related; Codeine; and Sulfonamide derivatives  Home Medications   Prior to Admission medications    Medication Sig Start Date End Date Taking? Authorizing Provider  aspirin 81 MG tablet Take 81 mg by mouth. Takes once a week    Historical Provider, MD  benzonatate (TESSALON) 100 MG capsule Take 100 mg by mouth 3 (three) times daily as needed for cough.    Historical Provider, MD  brimonidine (ALPHAGAN P) 0.1 % SOLN 1 drop 2 (two) times daily.    Historical Provider, MD  calcium carbonate (OS-CAL) 600 MG TABS Take 600 mg by mouth 2 (two) times daily with a meal.      Historical Provider, MD  cholecalciferol (VITAMIN D) 1000 UNITS tablet Take 1,000 Units by mouth daily.    Historical Provider, MD  cyanocobalamin 500 MCG tablet Take 500 mcg by mouth daily.      Historical Provider, MD  CycloSPORINE (RESTASIS OP) Apply to eye 2 (two) times daily.    Historical Provider, MD  docusate sodium (COLACE) 100 MG capsule Take 100 mg by mouth 2 (two) times daily.      Historical Provider, MD  dorzolamide-timolol (COSOPT) 22.3-6.8 MG/ML ophthalmic solution 1 drop 2 (two) times daily.    Historical Provider, MD  famciclovir [facility]) 500 MG tablet  01/04/11   Historical Provider, MD  fenofibrate 160 MG tablet  02/08/11   Historical Provider, MD  finasteride (PROSCAR) 5 MG tablet  12/24/10   Historical Provider, MD  ipratropium (ATROVENT)  0.03 % nasal spray Place 2 sprays into the nose every 12 (twelve) hours.    Historical Provider, MD  latanoprost (XALATAN) 0.005 % ophthalmic solution Place 1 drop into the left eye daily.    Historical Provider, MD  levothyroxine (SYNTHROID, LEVOTHROID) 100 MCG tablet Take 100 mcg by mouth daily before breakfast.    Historical Provider, MD  LORazepam (ATIVAN) 1 MG tablet Take by mouth every 8 (eight) hours.  02/12/11   Historical Provider, MD  Multiple Vitamin (MULTIVITAMIN) tablet Take 1 tablet by mouth daily.      Historical Provider, MD  oxybutynin (DITROPAN) 5 MG tablet Take 5 mg by mouth 3 (three) times daily.      Historical Provider, MD  [place] Name: Vitamin b  12 injection once a month    Historical Provider, MD  Vitamin B1-B12 5.5-0.0125 MG/5ML SOLN Take by mouth.    Historical Provider, MD   BP 139/55 mmHg  Pulse 99  Temp(Src) 98.1 F (36.7 C) (Oral)  Resp 19  Ht 5\' 10"  (1.778 m)  Wt 170 lb (77.111 kg)  BMI 24.39 kg/m2  SpO2 99% Physical Exam  Constitutional: He is oriented to person, place, and time. He appears well-developed and well-nourished.  HENT:  Head: Normocephalic and atraumatic.  Right Ear: External ear normal.  Left Ear: External ear normal.  Nose: Nose normal.  Mouth/Throat: Oropharynx is clear and moist.  Eyes: Conjunctivae and EOM are normal. Pupils are equal, round, and reactive to light.  Neck: Normal range of motion. Neck supple. No JVD present. No tracheal deviation present. No thyromegaly present.  Cardiovascular: Regular rhythm, normal heart sounds and intact distal pulses.  Exam reveals no gallop and no friction rub.   No murmur heard. Tachycardia, regular  Pulmonary/Chest: Effort normal and breath sounds normal. No stridor. No respiratory distress. He has no wheezes. He has no rales. He exhibits no tenderness.  Abdominal: Soft. Bowel sounds are normal. He exhibits no distension and no mass. There is no tenderness. There is no rebound and no guarding.  Musculoskeletal: Normal range of motion. He exhibits edema. He exhibits no tenderness.  Patient has edema of bilateral upper arms, left greater than right.  He has trace edema to lower extremities, compression stockings are in place  Lymphadenopathy:    He has no cervical adenopathy.  Neurological: He is alert and oriented to person, place, and time. He displays normal reflexes. No cranial nerve deficit. He exhibits normal muscle tone. Coordination (patient has weakness 4-5 strength to bilateral lower extremities) abnormal.  Skin: Skin is warm and dry. No rash noted. No erythema. No pallor.  Patient has skin breakdown with abrasion and bruising to right upper shoulder  concerning for early pressure skin changes, also has some erythema to mid back consistent with pressure injury.  No pressure wounds noted to buttocks.    Psychiatric: He has a normal mood and affect. His behavior is normal. Judgment and thought content normal.  Nursing note and vitals reviewed.   ED Course  Procedures (including critical care time) Labs Review Labs Reviewed  URINALYSIS, ROUTINE W REFLEX MICROSCOPIC (NOT AT [facility]) - Abnormal; Notable for the following:    Ketones, ur 40 (*)    All other components within normal limits  COMPREHENSIVE METABOLIC PANEL - Abnormal; Notable for the following:    Sodium 134 (*)    Potassium 3.4 (*)    Glucose, Bld 110 (*)    Creatinine, Ser 0.58 (*)    Calcium  7.9 (*)    Total Protein 4.7 (*)    Albumin 2.7 (*)    AST 46 (*)    Total Bilirubin 1.4 (*)    All other components within normal limits  CBC WITH DIFFERENTIAL/PLATELET - Abnormal; Notable for the following:    RBC 3.44 (*)    Hemoglobin 11.6 (*)    HCT 34.3 (*)    Neutrophils Relative % 84 (*)    Lymphocytes Relative 9 (*)    All other components within normal limits  CK - Abnormal; Notable for the following:    Total CK 960 (*)    All other components within normal limits  I-STAT CG4 LACTIC ACID, ED  I-STAT TROPOININ, ED    Imaging Review No results found.   EKG Interpretation   Date/Time:  Friday October 28 2014 05:12:14 EDT Ventricular Rate:  102 PR Interval:  193 QRS Duration: 109 QT Interval:  358 QTC Calculation: 466 R Axis:   0 Text Interpretation:  Sinus tachycardia with irregular rate frequent pvc  Low voltage, precordial leads Borderline repolarization abnormality No old  tracing to compare Confirmed by Yatziry Deakins  MD, Anthonny Schiller ([id number]) on 10/28/2014  5:16:23 AM    ED ECG REPORT   Date: 10/28/2014  Rate: 96  Rhythm: normal sinus rhythm and premature atrial contractions (PAC)  QRS Axis: left  Intervals: normal  ST/T Wave abnormalities: nonspecific ST/T  changes  Conduction Disutrbances:right bundle branch block  Narrative Interpretation:   Old EKG Reviewed: unchanged  I have personally reviewed the EKG tracing and agree with the computerized printout as noted.  MDM   Final diagnoses:  Fall at home, initial encounter  Generalized weakness  Elevated CK  Non-traumatic rhabdomyolysis   81 year old male with fall at home, with prolonged immobility.  Once he was sitting down.  He has skin changes that are concerning for early pressure sores.  He has weakness, lives alone and was able to get himself up from a seated position.  CK sent off to evaluate for rhabdomyolysis.  Labs sent.  I feel the patient most likely will need admission to the hospital given his fall, weakness and solitary living situation.  7:24 AM Care d/w triad hospitalist, who requests pelvis xray to evaluate for fractures.  Pt without buttock, hip, or pelvic pain, reports sitting down, not falling.  Will order xray for completeness.  Elevated in CK noted, ketones in urine.  Friend at bedside is concerned as patient has not eaten since lunch yesterday, will order breakfast tray.  Linton Flemings, MD 10/28/14 [phone]

## 2014-10-28 NOTE — ED Notes (Signed)
Pt transporting to CT  

## 2014-10-28 NOTE — ED Notes (Addendum)
Dr. Darrick Meigs, hospitalist at bedside. Discussing head CT with MD due to pt neuro symptoms at current time. Pt is in a fib, does not take any blood thinners for this.

## 2014-10-29 ENCOUNTER — Inpatient Hospital Stay (HOSPITAL_COMMUNITY): Payer: Medicare Other

## 2014-10-29 LAB — CBC
HEMATOCRIT: 32.7 % — AB (ref 39.0–52.0)
Hemoglobin: 11.1 g/dL — ABNORMAL LOW (ref 13.0–17.0)
MCH: 33.9 pg (ref 26.0–34.0)
MCHC: 33.9 g/dL (ref 30.0–36.0)
MCV: 100 fL (ref 78.0–100.0)
Platelets: 172 10*3/uL (ref 150–400)
RBC: 3.27 MIL/uL — ABNORMAL LOW (ref 4.22–5.81)
RDW: 14.3 % (ref 11.5–15.5)
WBC: 3.6 10*3/uL — ABNORMAL LOW (ref 4.0–10.5)

## 2014-10-29 LAB — COMPREHENSIVE METABOLIC PANEL
ALBUMIN: 2.2 g/dL — AB (ref 3.5–5.0)
ALT: 17 U/L (ref 17–63)
AST: 40 U/L (ref 15–41)
Alkaline Phosphatase: 58 U/L (ref 38–126)
Anion gap: 6 (ref 5–15)
BUN: 10 mg/dL (ref 6–20)
CALCIUM: 8.2 mg/dL — AB (ref 8.9–10.3)
CHLORIDE: 105 mmol/L (ref 101–111)
CO2: 28 mmol/L (ref 22–32)
Creatinine, Ser: 0.63 mg/dL (ref 0.61–1.24)
GFR calc non Af Amer: >60 mL/min (ref >60)
GLUCOSE: 110 mg/dL — AB (ref 65–99)
Potassium: 3.7 mmol/L (ref 3.5–5.1)
Sodium: 139 mmol/L (ref 135–145)
Total Bilirubin: 1.3 mg/dL — ABNORMAL HIGH (ref 0.3–1.2)
Total Protein: 4 g/dL — ABNORMAL LOW (ref 6.5–8.1)

## 2014-10-29 LAB — TROPONIN I

## 2014-10-29 LAB — CK: CK TOTAL: 384 U/L (ref 49–397)

## 2014-10-29 LAB — TSH: TSH: 3.46 u[IU]/mL (ref 0.350–4.500)

## 2014-10-29 MED ORDER — DIAZEPAM 5 MG/ML IJ SOLN
2.5000 mg | Freq: Once | INTRAMUSCULAR | Status: AC
  Administered 2014-10-29: 2.5 mg via INTRAVENOUS
  Filled 2014-10-29: qty 2

## 2014-10-29 MED ORDER — DILTIAZEM HCL 60 MG PO TABS
30.0000 mg | ORAL_TABLET | Freq: Three times a day (TID) | ORAL | Status: DC
  Administered 2014-10-29 – 2014-10-31 (×7): 30 mg via ORAL
  Filled 2014-10-29 (×7): qty 1

## 2014-10-29 MED ORDER — LORAZEPAM 2 MG/ML IJ SOLN
0.5000 mg | Freq: Once | INTRAMUSCULAR | Status: AC
  Administered 2014-10-29: 0.5 mg via INTRAVENOUS
  Filled 2014-10-29: qty 1

## 2014-10-29 MED ORDER — GADOBENATE DIMEGLUMINE 529 MG/ML IV SOLN
15.0000 mL | Freq: Once | INTRAVENOUS | Status: AC | PRN
  Administered 2014-10-29: 15 mL via INTRAVENOUS

## 2014-10-29 MED ORDER — LORAZEPAM 2 MG/ML IJ SOLN
0.5000 mg | Freq: Once | INTRAMUSCULAR | Status: AC
  Administered 2014-10-29: 0.5 mg via INTRAVENOUS

## 2014-10-29 NOTE — Progress Notes (Signed)
Pt brought down during the night ; pt claustro ; states he is unable to lay flat due being sob. Patient states he'll never be able to tolerate the exam duration . Can anxiety meds be ordered? -Lynelle Doctor

## 2014-10-29 NOTE — Progress Notes (Addendum)
[facility] PROGRESS NOTE  NAVDEEP HALT GHW:[id number] DOB: 1932/06/20 DOA: 10/28/2014 PCP: Jerlyn Ly, MD  Assessment/Plan: Active Problems:   Weakness   Cellulitis    Generalized weakness Patient presenting with generalized weakness, mild elevation of CK. We'll repeat, X-ray of the pelvis was done which is negative for any acute fracture. Continue IV hydration. Follow CK  Once patient stable consider PT OT evaluation.  CT scan of the head concerning for lesion at the craniocervical junction, cavernous hemangioma versus neoplasm versus hemorrhage less likely MRI of the brain/cervical spine pending  Cellulitis Patient has left lower extremity erythema and warmth, tenderness to palpation. Venous Doppler shows negative for DVT. 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 Continue vancomycin per pharmacy consultation for possible cellulitis.  ? A. Fib Patient's cardiac monitor in the ED shows possible A. fib previous EKG showed sinus tachycardia with PVCs, will repeat EKG. Check 2 d echo, cardiac monitoring , likely new onset , start PO cardizem  DVT prophylaxis SCDs   Code Status:      Code Status Orders    DO NOT RESUSCITATE    Start     Ordered    Family Communication: family updated about patient's clinical progress Disposition Plan:  PT/OT evaluation if MRI negative   Brief narrative: 81 year old male who  has a past medical history of Glaucoma; Allergy; Anemia; Anxiety; Colon cancer (2003); and Hypothyroidism. Today was brought to the ED by EMS after a fall. Patient says that he was sitting in his computer room when he stood up to go out of the room and he may have had a misstep and then down on the floor on his buttocks. He denies loss of consciousness. Did not hit his head patient says that he could not get up from the sitting position until around 3 AM when he called a friend and called 911. Patient had carpal tunnel surgery  and left hand on 09/28/2014 and was prescribed tramadol when necessary for pain which she has been taking occasionally. Patient says that he has been unsteady on his gait over the past few weeks, denies any dysarthria, no nausea vomiting or diarrhea. No chest pain shortness of breath. No history of seizures. No recent fever. Patient does have history of BPH and is followed by urology. In the ED patient found to have elevated CK of 960.   Consultants:  None  Procedures:  None  Antibiotics: Anti-infectives    Start     Dose/Rate Route Frequency Ordered Stop   10/28/14 1200  vancomycin (VANCOCIN) IVPB 750 mg/150 ml premix     750 mg 150 mL/hr over 60 Minutes Intravenous Every 12 hours 10/28/14 1136           HPI/Subjective: Anxious about having an MRI done  Objective: Filed Vitals:   10/28/14 1300 10/28/14 2221 10/29/14 0530 10/29/14 0654  BP: 174/94 122/65 141/108 147/76  Pulse: 89 79 83 78  Temp: 98.6 F (37 C) 99.1 F (37.3 C) 97.3 F (36.3 C)   TempSrc: Oral Oral Oral   Resp: 16 16 16    Height:      Weight:      SpO2: 100% 93% 100%     Intake/Output Summary (Last 24 hours) at 10/29/14 1208 Last data filed at 10/29/14 0246  Gross per 24 hour  Intake    480 ml  Output   1600 ml  Net  -1120 ml    Exam:  General: No acute respiratory distress Lungs: Clear to auscultation bilaterally without wheezes or crackles Cardiovascular: Regular rate and rhythm without murmur gallop or rub normal S1 and S2 Abdomen: Nontender, nondistended, soft, bowel sounds positive, no rebound, no ascites, no appreciable mass Extremities: No significant cyanosis, clubbing, or edema bilateral lower extremities     Data Review   Micro Results Recent Results (from the past 240 hour(s))  Culture, blood (routine x 2)     Status: None (Preliminary result)   Collection Time: 10/28/14 11:40 AM  Result Value Ref Range Status   Specimen Description BLOOD RIGHT ANTECUBITAL  Final    Special Requests BOTTLES DRAWN AEROBIC AND ANAEROBIC 5CC  Final   Culture PENDING  Incomplete   Report Status PENDING  Incomplete    Radiology Reports Dg Pelvis 1-2 Views  10/28/2014   CLINICAL DATA:  Pain following fall  EXAM: PELVIS - 1-2 VIEW  COMPARISON:  None.  FINDINGS: There is no evidence of pelvic fracture or dislocation. There is mild symmetric narrowing of both hip joints. There are foci of arterial vascular calcification.  IMPRESSION: Symmetric narrowing of both hip joints. No acute fracture or dislocation.   Electronically Signed   By: Lowella Grip III M.D.   On: 10/28/2014 07:56   Ct Head Wo Contrast  10/28/2014   CLINICAL DATA:  Fall at approximately 8 p.m. last evening. Unable to get up. He called EMS at 3:30 a.m. Bilateral lower extremity weakness. Incontinent.  EXAM: CT HEAD WITHOUT CONTRAST  TECHNIQUE: Contiguous axial images were obtained from the base of the skull through the vertex without intravenous contrast.  COMPARISON:  PET scan 11/17/2006.  FINDINGS: A calcified intra-axial lesion at the craniocervical junction is slightly larger in size than on the prior study. It now measures 13 x 8 mm. High density of the anterior and right lateral dura versus is a small amount of subdural hemorrhage is noted. There was some density of the dura previously.  Moderate generalized atrophy and white matter disease is present bilaterally. No other focal hemorrhage is present. The ventricles are proportionate to the degree of atrophy. No significant extraaxial fluid collection is present.  No significant extracranial soft tissue injury is present. Bilateral lens replacements are noted. The globes and orbits are otherwise intact. The calvarium is normal. Polyps or mucous retention cysts are again noted a within the left greater than right maxillary sinus. The paranasal sinuses and mastoid air cells are otherwise clear.  IMPRESSION: 1. Calcified intra-axial lesion at the craniocervical junction  is slightly larger than on the prior study. This may represent a cavernous hemangioma. Neoplasm or metastatic disease is considered unlikely given the relative stability over 10 years. 2. Dural thickening versus small subdural hemorrhage anterior to the spinal cord and medulla. There is some calcification of the dura. 3. The combination could represent a cavernous hemangioma with hemorrhage versus other vascular malformation. Recommend MRI of the brain and cervical spine without and with contrast for further evaluation. 4. Moderate generalized atrophy and white matter disease bilaterally likely reflects the sequela of chronic microvascular ischemia. 5. No other acute abnormality. These results were called by telephone at the time of interpretation on 10/28/2014 at 11:07 am to Dr. Eleonore Chiquito , who verbally acknowledged these results.   Electronically Signed   By: San Morelle M.D.   On: 10/28/2014 11:07     CBC  Recent Labs Lab 10/28/14 0543 10/28/14 1143 10/29/14 0353  WBC 7.5 5.0 3.6*  HGB 11.6* 11.7* 11.1*  HCT 34.3* 34.5* 32.7*  PLT 187 172 172  MCV 99.7 100.0 100.0  MCH 33.7 33.9 33.9  MCHC 33.8 33.9 33.9  RDW 14.1 14.2 14.3  LYMPHSABS 0.7  --   --   MONOABS 0.5  --   --   EOSABS 0.0  --   --   BASOSABS 0.0  --   --     Chemistries   Recent Labs Lab 10/28/14 0543 10/28/14 1143 10/29/14 0353  NA 134*  --  139  K 3.4*  --  3.7  CL 101  --  105  CO2 26  --  28  GLUCOSE 110*  --  110*  BUN 14  --  10  CREATININE 0.58* 0.51* 0.63  CALCIUM 7.9*  --  8.2*  AST 46*  --  40  ALT 20  --  17  ALKPHOS 69  --  58  BILITOT 1.4*  --  1.3*   ------------------------------------------------------------------------------------------------------------------ estimated creatinine clearance is 77.3 mL/min (by C-G formula based on Cr of 0.63). ------------------------------------------------------------------------------------------------------------------ No results for input(s):  HGBA1C in the last 72 hours. ------------------------------------------------------------------------------------------------------------------ No results for input(s): CHOL, HDL, LDLCALC, TRIG, CHOLHDL, LDLDIRECT in the last 72 hours. ------------------------------------------------------------------------------------------------------------------  Recent Labs  10/28/14 1143  TSH 1.015   ------------------------------------------------------------------------------------------------------------------ No results for input(s): VITAMINB12, FOLATE, FERRITIN, TIBC, IRON, RETICCTPCT in the last 72 hours.  Coagulation profile No results for input(s): INR, PROTIME in the last 168 hours.  No results for input(s): DDIMER in the last 72 hours.  Cardiac Enzymes No results for input(s): CKMB, TROPONINI, MYOGLOBIN in the last 168 hours.  Invalid input(s): CK ------------------------------------------------------------------------------------------------------------------ Invalid input(s): POCBNP   CBG: No results for input(s): GLUCAP in the last 168 hours.     Studies: Dg Pelvis 1-2 Views  10/28/2014   CLINICAL DATA:  Pain following fall  EXAM: PELVIS - 1-2 VIEW  COMPARISON:  None.  FINDINGS: There is no evidence of pelvic fracture or dislocation. There is mild symmetric narrowing of both hip joints. There are foci of arterial vascular calcification.  IMPRESSION: Symmetric narrowing of both hip joints. No acute fracture or dislocation.   Electronically Signed   By: Lowella Grip III M.D.   On: 10/28/2014 07:56   Ct Head Wo Contrast  10/28/2014   CLINICAL DATA:  Fall at approximately 8 p.m. last evening. Unable to get up. He called EMS at 3:30 a.m. Bilateral lower extremity weakness. Incontinent.  EXAM: CT HEAD WITHOUT CONTRAST  TECHNIQUE: Contiguous axial images were obtained from the base of the skull through the vertex without intravenous contrast.  COMPARISON:  PET scan 11/17/2006.   FINDINGS: A calcified intra-axial lesion at the craniocervical junction is slightly larger in size than on the prior study. It now measures 13 x 8 mm. High density of the anterior and right lateral dura versus is a small amount of subdural hemorrhage is noted. There was some density of the dura previously.  Moderate generalized atrophy and white matter disease is present bilaterally. No other focal hemorrhage is present. The ventricles are proportionate to the degree of atrophy. No significant extraaxial fluid collection is present.  No significant extracranial soft tissue injury is present. Bilateral lens replacements are noted. The globes and orbits are otherwise intact. The calvarium is normal. Polyps or mucous retention cysts are again noted a within the left greater than right maxillary sinus. The paranasal sinuses and mastoid air cells are otherwise clear.  IMPRESSION: 1. Calcified intra-axial lesion at the craniocervical junction is slightly  larger than on the prior study. This may represent a cavernous hemangioma. Neoplasm or metastatic disease is considered unlikely given the relative stability over 10 years. 2. Dural thickening versus small subdural hemorrhage anterior to the spinal cord and medulla. There is some calcification of the dura. 3. The combination could represent a cavernous hemangioma with hemorrhage versus other vascular malformation. Recommend MRI of the brain and cervical spine without and with contrast for further evaluation. 4. Moderate generalized atrophy and white matter disease bilaterally likely reflects the sequela of chronic microvascular ischemia. 5. No other acute abnormality. These results were called by telephone at the time of interpretation on 10/28/2014 at 11:07 am to Dr. Eleonore Chiquito , who verbally acknowledged these results.   Electronically Signed   By: San Morelle M.D.   On: 10/28/2014 11:07      No results found for: HGBA1C Lab Results  Component Value Date    CREATININE 0.63 10/29/2014       Scheduled Meds: . aspirin EC  81 mg Oral Daily  . cycloSPORINE  1 drop Both Eyes BID  . dorzolamide-timolol  1 drop Left Eye BID  . latanoprost  1 drop Left Eye QHS  . levothyroxine  100 mcg Oral QAC breakfast  . LORazepam  0.5 mg Intravenous Once  . sodium chloride  3 mL Intravenous Q12H  . vancomycin  750 mg Intravenous Q12H   Continuous Infusions: . sodium chloride 75 mL/hr at 10/29/14 0246    Active Problems:   Weakness   Cellulitis    Time spent: 45 minutes   [facility] Pager [phone]. If 7PM-7AM, please contact night-coverage at [email or web address], password [facility] 10/29/2014, 12:08 PM  LOS: 1 day

## 2014-10-30 ENCOUNTER — Other Ambulatory Visit (HOSPITAL_COMMUNITY): Payer: Medicare Other

## 2014-10-30 ENCOUNTER — Inpatient Hospital Stay (HOSPITAL_COMMUNITY): Payer: Medicare Other

## 2014-10-30 ENCOUNTER — Encounter (HOSPITAL_COMMUNITY): Payer: Self-pay

## 2014-10-30 LAB — CK
CK TOTAL: 332 U/L (ref 49–397)
CK TOTAL: 620 U/L — AB (ref 49–397)

## 2014-10-30 MED ORDER — DOCUSATE SODIUM 100 MG PO CAPS
300.0000 mg | ORAL_CAPSULE | Freq: Every day | ORAL | Status: DC
  Administered 2014-10-30 – 2014-10-31 (×2): 300 mg via ORAL
  Filled 2014-10-30 (×2): qty 3

## 2014-10-30 MED ORDER — PILOCARPINE HCL 1 % OP SOLN
1.0000 [drp] | Freq: Three times a day (TID) | OPHTHALMIC | Status: DC
  Administered 2014-10-30 – 2014-10-31 (×2): 1 [drp] via OPHTHALMIC
  Filled 2014-10-30: qty 15

## 2014-10-30 MED ORDER — OXYBUTYNIN CHLORIDE 5 MG PO TABS
5.0000 mg | ORAL_TABLET | Freq: Three times a day (TID) | ORAL | Status: DC
  Administered 2014-10-30 – 2014-10-31 (×3): 5 mg via ORAL
  Filled 2014-10-30 (×3): qty 1

## 2014-10-30 MED ORDER — DEXAMETHASONE SODIUM PHOSPHATE 4 MG/ML IJ SOLN: 4.0000 mg | Freq: Four times a day (QID) | INTRAMUSCULAR | Status: DC

## 2014-10-30 MED ORDER — DEXTROSE 5 % IV SOLN
500.0000 mg | Freq: Three times a day (TID) | INTRAVENOUS | Status: DC | PRN
  Administered 2014-10-30: 500 mg via INTRAVENOUS
  Filled 2014-10-30 (×2): qty 5

## 2014-10-30 MED ORDER — IOHEXOL 300 MG/ML  SOLN
100.0000 mL | Freq: Once | INTRAMUSCULAR | Status: AC | PRN
  Administered 2014-10-30: 100 mL via INTRAVENOUS

## 2014-10-30 MED ORDER — DEXAMETHASONE SODIUM PHOSPHATE 4 MG/ML IJ SOLN
4.0000 mg | Freq: Four times a day (QID) | INTRAMUSCULAR | Status: DC
  Administered 2014-10-30 – 2014-10-31 (×5): 4 mg via INTRAVENOUS
  Filled 2014-10-30 (×5): qty 1

## 2014-10-30 MED ORDER — CALCIUM CARBONATE 1250 (500 CA) MG PO TABS
1.0000 | ORAL_TABLET | Freq: Every day | ORAL | Status: DC
  Administered 2014-10-31: 500 mg via ORAL
  Filled 2014-10-30: qty 1

## 2014-10-30 MED ORDER — FINASTERIDE 5 MG PO TABS
5.0000 mg | ORAL_TABLET | Freq: Every day | ORAL | Status: DC
  Administered 2014-10-30 – 2014-10-31 (×2): 5 mg via ORAL
  Filled 2014-10-30 (×2): qty 1

## 2014-10-30 MED ORDER — LORAZEPAM 0.5 MG PO TABS
0.5000 mg | ORAL_TABLET | Freq: Every day | ORAL | Status: DC
  Administered 2014-10-30: 0.5 mg via ORAL
  Filled 2014-10-30: qty 1

## 2014-10-30 MED ORDER — CALCIUM CARBONATE 600 MG PO TABS
600.0000 mg | ORAL_TABLET | Freq: Every day | ORAL | Status: DC
  Filled 2014-10-30: qty 1

## 2014-10-30 MED ORDER — ENOXAPARIN SODIUM 40 MG/0.4ML ~~LOC~~ SOLN
40.0000 mg | SUBCUTANEOUS | Status: DC
  Administered 2014-10-30 – 2014-10-31 (×2): 40 mg via SUBCUTANEOUS
  Filled 2014-10-30 (×2): qty 0.4

## 2014-10-30 MED ORDER — VITAMIN B-12 1000 MCG PO TABS
500.0000 ug | ORAL_TABLET | Freq: Every day | ORAL | Status: DC
  Administered 2014-10-30 – 2014-10-31 (×2): 500 ug via ORAL
  Filled 2014-10-30 (×2): qty 1

## 2014-10-30 NOTE — Progress Notes (Signed)
Lovenox for VTE prophylaxis was held due to concern on CT of lesion, cavernous hemangioma vs neoplasm vs hemorrhage.  MRI neg for hemorrhage.  Patient is not obese and does not have any renal dysfunction. Therefore, standard Lovenox dosing for VTE prophylaxis applies- 40mg  subQ daily.  Pharmacy will not follow this consult as no dose adjustments anticipated.

## 2014-10-30 NOTE — Progress Notes (Signed)
PT Cancellation Note  Patient Details Name: JAVONI LUCKEN MRN: [id number] DOB: [birth year]   Cancelled Treatment:    Reason Eval/Treat Not Completed: Patient not medically ready Hold on PT eval per MD progress note. Awaiting neurology consult.   Lorriane Shire 10/30/2014, 12:45 PM

## 2014-10-30 NOTE — Progress Notes (Addendum)
[facility] PROGRESS NOTE  [name] Hall WYO:[id number] DOB: Sep 14, 1932 DOA: 10/28/2014 PCP: [name] Ly, MD  Assessment/Plan: Active Problems:   Weakness   Cellulitis    Generalized weakness due to cord compressiom MRI 17 x 23 x 28 mm meningioma at the foramen magnum, resulting in severe effacement of the spinal cord, neurosurgery consulted ,Dr Donald Pore Will start him on decadron , robaxin given hyperreflexia from cord compression  generalized weakness due to above , mild elevation of CK.  CK elevated, improving , Continue IV hydration. Follow CK Hold off on  PT OT evaluation.  Defer further imaging to neurosurgery  Cellulitis Patient has left lower extremity erythema and warmth, tenderness to palpation.  Venous Doppler shows negative for DVT.  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 Continue vancomycin per pharmacy consultation for possible cellulitis.  Irregular heartbeat, EKG documents PACs, no evidence of atrial fibrillation Echocardiogram pending, continue cardiac monitoring , continue PO cardizem   DVT prophylaxis Start Lovenox for DVT prophylaxis   Code Status:      Code Status Orders    DO NOT RESUSCITATE    Start     Ordered    Family Communication: Discussed clinical presentation with the patient's sister [name] Hall, [phone], she will discuss it with the rest of the family and decide who the healthcare power of attorney would be Disposition Plan:  PT/OT evaluation if MRI negative   Brief narrative: 81 year old male who  has a past medical history of Glaucoma; Allergy; Anemia; Anxiety; Colon cancer (2003); and Hypothyroidism. Today was brought to the ED by EMS after a fall. Patient says that he was sitting in his computer room when he stood up to go out of the room and he may have had a misstep and then down on the floor on his buttocks. He denies loss of consciousness. Did not hit his head patient says that he  could not get up from the sitting position until around 3 AM when he called a friend and called 911. Patient had carpal tunnel surgery and left hand on 09/28/2014 and was prescribed tramadol when necessary for pain which she has been taking occasionally. Patient says that he has been unsteady on his gait over the past few weeks, denies any dysarthria, no nausea vomiting or diarrhea. No chest pain shortness of breath. No history of seizures. No recent fever. Patient does have history of BPH and is followed by urology. In the ED patient found to have elevated CK of 960.   Consultants:  None  Procedures:  None  Antibiotics: Anti-infectives    Start     Dose/Rate Route Frequency Ordered Stop   10/28/14 1200  vancomycin (VANCOCIN) IVPB 750 mg/150 ml premix     750 mg 150 mL/hr over 60 Minutes Intravenous Every 12 hours 10/28/14 1136           HPI/Subjective: Patient is not confused, he has jerking of her bilateral upper and lower extremities, no seizure activity witnessed by his friend who is by the bedside  Objective: Filed Vitals:   10/29/14 0530 10/29/14 0654 10/29/14 1500 10/29/14 2315  BP: 141/108 147/76 152/88 135/60  Pulse: 83 78 89 72  Temp: 97.3 F (36.3 C)  98.1 F (36.7 C) 98.2 F (36.8 C)  TempSrc: Oral  Oral Oral  Resp: 16  17 16   Height:      Weight:      SpO2: 100%  100% 99%  Intake/Output Summary (Last 24 hours) at 10/30/14 1033 Last data filed at 10/30/14 0114  Gross per 24 hour  Intake    516 ml  Output      0 ml  Net    516 ml    Exam:  General: No acute respiratory distress Lungs: Clear to auscultation bilaterally without wheezes or crackles Cardiovascular: Regular rate and rhythm without murmur gallop or rub normal S1 and S2 Abdomen: Nontender, nondistended, soft, bowel sounds positive, no rebound, no ascites, no appreciable mass Extremities: No significant cyanosis, clubbing, or edema bilateral lower extremities     Data Review    Micro Results Recent Results (from the past 240 hour(s))  Culture, blood (routine x 2)     Status: None (Preliminary result)   Collection Time: 10/28/14 11:40 AM  Result Value Ref Range Status   Specimen Description BLOOD RIGHT ANTECUBITAL  Final   Special Requests BOTTLES DRAWN AEROBIC AND ANAEROBIC 5CC  Final   Culture PENDING  Incomplete   Report Status PENDING  Incomplete  Culture, blood (routine x 2)     Status: None (Preliminary result)   Collection Time: 10/28/14 11:58 AM  Result Value Ref Range Status   Specimen Description BLOOD RIGHT HAND  Final   Special Requests BOTTLES DRAWN AEROBIC ONLY 6CC  Final   Culture NO GROWTH 1 DAY  Final   Report Status PENDING  Incomplete    Radiology Reports Dg Pelvis 1-2 Views  10/28/2014   CLINICAL DATA:  Pain following fall  EXAM: PELVIS - 1-2 VIEW  COMPARISON:  None.  FINDINGS: There is no evidence of pelvic fracture or dislocation. There is mild symmetric narrowing of both hip joints. There are foci of arterial vascular calcification.  IMPRESSION: Symmetric narrowing of both hip joints. No acute fracture or dislocation.   Electronically Signed   By: Lowella Grip III M.D.   On: 10/28/2014 07:56   Ct Head Wo Contrast  10/28/2014   CLINICAL DATA:  Fall at approximately 8 p.m. last evening. Unable to get up. He called EMS at 3:30 a.m. Bilateral lower extremity weakness. Incontinent.  EXAM: CT HEAD WITHOUT CONTRAST  TECHNIQUE: Contiguous axial images were obtained from the base of the skull through the vertex without intravenous contrast.  COMPARISON:  PET scan 11/17/2006.  FINDINGS: A calcified intra-axial lesion at the craniocervical junction is slightly larger in size than on the prior study. It now measures 13 x 8 mm. High density of the anterior and right lateral dura versus is a small amount of subdural hemorrhage is noted. There was some density of the dura previously.  Moderate generalized atrophy and white matter disease is present  bilaterally. No other focal hemorrhage is present. The ventricles are proportionate to the degree of atrophy. No significant extraaxial fluid collection is present.  No significant extracranial soft tissue injury is present. Bilateral lens replacements are noted. The globes and orbits are otherwise intact. The calvarium is normal. Polyps or mucous retention cysts are again noted a within the left greater than right maxillary sinus. The paranasal sinuses and mastoid air cells are otherwise clear.  IMPRESSION: 1. Calcified intra-axial lesion at the craniocervical junction is slightly larger than on the prior study. This may represent a cavernous hemangioma. Neoplasm or metastatic disease is considered unlikely given the relative stability over 10 years. 2. Dural thickening versus small subdural hemorrhage anterior to the spinal cord and medulla. There is some calcification of the dura. 3. The combination could represent a cavernous hemangioma  with hemorrhage versus other vascular malformation. Recommend MRI of the brain and cervical spine without and with contrast for further evaluation. 4. Moderate generalized atrophy and white matter disease bilaterally likely reflects the sequela of chronic microvascular ischemia. 5. No other acute abnormality. These results were called by telephone at the time of interpretation on 10/28/2014 at 11:07 am to Dr. Eleonore Chiquito , who verbally acknowledged these results.   Electronically Signed   By: San Morelle M.D.   On: 10/28/2014 11:07   Mr Jeri Cos TT Contrast  10/29/2014   CLINICAL DATA:  Generalized weakness, follow-up head CT. History of colon cancer.  EXAM: MRI HEAD WITHOUT AND WITH CONTRAST  MRI CERVICAL SPINE WITHOUT AND WITH CONTRAST  TECHNIQUE: Multiplanar, multiecho pulse sequences of the brain and surrounding structures, and cervical spine, to include the craniocervical junction and cervicothoracic junction, were obtained without and with intravenous contrast.   CONTRAST:  49mL MULTIHANCE GADOBENATE DIMEGLUMINE 529 MG/ML IV SOLN  COMPARISON:  CT head October 28, 2014  FINDINGS: MRI HEAD FINDINGS  Moderate to severely motion degraded examination, patient received sedation. The post gadolinium sequences are severely motion degraded. Low T1, low T2 17 x 23 x 28 mm (transverse by AP by CC) homogeneously enhancing mass with ventral dural tail consistent with meningioma completely effaces the foramen magnum, laterally displacing the vertebral artery flow voids which are present. The spinal cord is flattened, displaced to the RIGHT without cord edema. A second similar signal characteristic 6 x 11 mm mass arises from the RIGHT petrous apex, within the pre pontine cistern.  No reduced diffusion to suggest acute ischemia. No lobar hematoma. Motion degraded axial gradient sequences limit sensitivity for small hemorrhage. Ventricles and sulci are normal for age. Patchy supratentorial white matter T2 hyperintensities. No midline shift, mass effect or nor abnormal parenchymal enhancement/mass.  No abnormal extra-axial fluid collections. Normal major intracranial vascular flow voids seen at the skull base. Ocular globes and orbital contents are normal; status post bilateral ocular lens implants. Patient appears edentulous. LEFT maxillary mucosal retention cyst. The mastoid air cells are well aerated.  MRI CERVICAL SPINE FINDINGS  Severely motion degraded examination. Re- demonstration of foramen magnum meningioma. The vertebral bodies appear intact and aligned with maintenance of the cervical lordosis. Intervertebral discs demonstrate generally normal morphology, decreased T2 signal consistent with desiccation.  Mild canal stenosis.  No definite spinal cord edema, no syrinx.  Due to severely motion degraded axial sequences, limited assessment of individual levels.  IMPRESSION: Motion degraded examination, markedly limiting evaluation.  17 x 23 x 28 mm meningioma at the foramen magnum,  resulting in severe effacement of the spinal cord, no definite cord edema and no syrinx though motion degrades evaluation.  6 x 11 mm meningioma RIGHT petrous apex/ pre pontine cistern.  No acute intracranial process. Involutional changes. Moderate white matter changes compatible with chronic small vessel ischemic disease.  No acute fracture malalignment of the cervical spine.   Electronically Signed   By: Elon Alas M.D.   On: 10/29/2014 23:26   Mr Cervical Spine W Wo Contrast  10/29/2014   CLINICAL DATA:  Generalized weakness, follow-up head CT. History of colon cancer.  EXAM: MRI HEAD WITHOUT AND WITH CONTRAST  MRI CERVICAL SPINE WITHOUT AND WITH CONTRAST  TECHNIQUE: Multiplanar, multiecho pulse sequences of the brain and surrounding structures, and cervical spine, to include the craniocervical junction and cervicothoracic junction, were obtained without and with intravenous contrast.  CONTRAST:  80mL MULTIHANCE GADOBENATE DIMEGLUMINE 529 MG/ML IV  SOLN  COMPARISON:  CT head October 28, 2014  FINDINGS: MRI HEAD FINDINGS  Moderate to severely motion degraded examination, patient received sedation. The post gadolinium sequences are severely motion degraded. Low T1, low T2 17 x 23 x 28 mm (transverse by AP by CC) homogeneously enhancing mass with ventral dural tail consistent with meningioma completely effaces the foramen magnum, laterally displacing the vertebral artery flow voids which are present. The spinal cord is flattened, displaced to the RIGHT without cord edema. A second similar signal characteristic 6 x 11 mm mass arises from the RIGHT petrous apex, within the pre pontine cistern.  No reduced diffusion to suggest acute ischemia. No lobar hematoma. Motion degraded axial gradient sequences limit sensitivity for small hemorrhage. Ventricles and sulci are normal for age. Patchy supratentorial white matter T2 hyperintensities. No midline shift, mass effect or nor abnormal parenchymal enhancement/mass.   No abnormal extra-axial fluid collections. Normal major intracranial vascular flow voids seen at the skull base. Ocular globes and orbital contents are normal; status post bilateral ocular lens implants. Patient appears edentulous. LEFT maxillary mucosal retention cyst. The mastoid air cells are well aerated.  MRI CERVICAL SPINE FINDINGS  Severely motion degraded examination. Re- demonstration of foramen magnum meningioma. The vertebral bodies appear intact and aligned with maintenance of the cervical lordosis. Intervertebral discs demonstrate generally normal morphology, decreased T2 signal consistent with desiccation.  Mild canal stenosis.  No definite spinal cord edema, no syrinx.  Due to severely motion degraded axial sequences, limited assessment of individual levels.  IMPRESSION: Motion degraded examination, markedly limiting evaluation.  17 x 23 x 28 mm meningioma at the foramen magnum, resulting in severe effacement of the spinal cord, no definite cord edema and no syrinx though motion degrades evaluation.  6 x 11 mm meningioma RIGHT petrous apex/ pre pontine cistern.  No acute intracranial process. Involutional changes. Moderate white matter changes compatible with chronic small vessel ischemic disease.  No acute fracture malalignment of the cervical spine.   Electronically Signed   By: Elon Alas M.D.   On: 10/29/2014 23:26   Dg Hand 2 View Left  10/29/2014   CLINICAL DATA:  Left hand numbness for 1 month. Left and swelling across metacarpals. Surgery to relieve pressure on the nerve of the hand 1 month ago.  EXAM: LEFT HAND - 2 VIEW  COMPARISON:  None.  FINDINGS: IV tubing overlies the wrist, obscuring detail. There is no evidence for acute fracture or dislocation. No soft tissue foreign body or gas identified.  IMPRESSION: No evidence for acute  abnormality.   Electronically Signed   By: Nolon Nations M.D.   On: 10/29/2014 14:42     CBC  Recent Labs Lab 10/28/14 0543 10/28/14 1143  10/29/14 0353  WBC 7.5 5.0 3.6*  HGB 11.6* 11.7* 11.1*  HCT 34.3* 34.5* 32.7*  PLT 187 172 172  MCV 99.7 100.0 100.0  MCH 33.7 33.9 33.9  MCHC 33.8 33.9 33.9  RDW 14.1 14.2 14.3  LYMPHSABS 0.7  --   --   MONOABS 0.5  --   --   EOSABS 0.0  --   --   BASOSABS 0.0  --   --     Chemistries   Recent Labs Lab 10/28/14 0543 10/28/14 1143 10/29/14 0353  NA 134*  --  139  K 3.4*  --  3.7  CL 101  --  105  CO2 26  --  28  GLUCOSE 110*  --  110*  BUN 14  --  10  CREATININE 0.58* 0.51* 0.63  CALCIUM 7.9*  --  8.2*  AST 46*  --  40  ALT 20  --  17  ALKPHOS 69  --  58  BILITOT 1.4*  --  1.3*   ------------------------------------------------------------------------------------------------------------------ estimated creatinine clearance is 77.3 mL/min (by C-G formula based on Cr of 0.63). ------------------------------------------------------------------------------------------------------------------ No results for input(s): HGBA1C in the last 72 hours. ------------------------------------------------------------------------------------------------------------------ No results for input(s): CHOL, HDL, LDLCALC, TRIG, CHOLHDL, LDLDIRECT in the last 72 hours. ------------------------------------------------------------------------------------------------------------------  Recent Labs  2014/11/05 1300  TSH 3.460   ------------------------------------------------------------------------------------------------------------------ No results for input(s): VITAMINB12, FOLATE, FERRITIN, TIBC, IRON, RETICCTPCT in the last 72 hours.  Coagulation profile No results for input(s): INR, PROTIME in the last 168 hours.  No results for input(s): DDIMER in the last 72 hours.  Cardiac Enzymes  Recent Labs Lab 11-05-14 1415  TROPONINI <0.03   ------------------------------------------------------------------------------------------------------------------ Invalid input(s):  POCBNP   CBG: No results for input(s): GLUCAP in the last 168 hours.     Studies: Mr Kizzie Fantasia Contrast  11/05/14   CLINICAL DATA:  Generalized weakness, follow-up head CT. History of colon cancer.  EXAM: MRI HEAD WITHOUT AND WITH CONTRAST  MRI CERVICAL SPINE WITHOUT AND WITH CONTRAST  TECHNIQUE: Multiplanar, multiecho pulse sequences of the brain and surrounding structures, and cervical spine, to include the craniocervical junction and cervicothoracic junction, were obtained without and with intravenous contrast.  CONTRAST:  38mL MULTIHANCE GADOBENATE DIMEGLUMINE 529 MG/ML IV SOLN  COMPARISON:  CT head October 28, 2014  FINDINGS: MRI HEAD FINDINGS  Moderate to severely motion degraded examination, patient received sedation. The post gadolinium sequences are severely motion degraded. Low T1, low T2 17 x 23 x 28 mm (transverse by AP by CC) homogeneously enhancing mass with ventral dural tail consistent with meningioma completely effaces the foramen magnum, laterally displacing the vertebral artery flow voids which are present. The spinal cord is flattened, displaced to the RIGHT without cord edema. A second similar signal characteristic 6 x 11 mm mass arises from the RIGHT petrous apex, within the pre pontine cistern.  No reduced diffusion to suggest acute ischemia. No lobar hematoma. Motion degraded axial gradient sequences limit sensitivity for small hemorrhage. Ventricles and sulci are normal for age. Patchy supratentorial white matter T2 hyperintensities. No midline shift, mass effect or nor abnormal parenchymal enhancement/mass.  No abnormal extra-axial fluid collections. Normal major intracranial vascular flow voids seen at the skull base. Ocular globes and orbital contents are normal; status post bilateral ocular lens implants. Patient appears edentulous. LEFT maxillary mucosal retention cyst. The mastoid air cells are well aerated.  MRI CERVICAL SPINE FINDINGS  Severely motion degraded examination.  Re- demonstration of foramen magnum meningioma. The vertebral bodies appear intact and aligned with maintenance of the cervical lordosis. Intervertebral discs demonstrate generally normal morphology, decreased T2 signal consistent with desiccation.  Mild canal stenosis.  No definite spinal cord edema, no syrinx.  Due to severely motion degraded axial sequences, limited assessment of individual levels.  IMPRESSION: Motion degraded examination, markedly limiting evaluation.  17 x 23 x 28 mm meningioma at the foramen magnum, resulting in severe effacement of the spinal cord, no definite cord edema and no syrinx though motion degrades evaluation.  6 x 11 mm meningioma RIGHT petrous apex/ pre pontine cistern.  No acute intracranial process. Involutional changes. Moderate white matter changes compatible with chronic small vessel ischemic disease.  No acute fracture malalignment of the cervical spine.   Electronically Signed   By: Sandie Ano  Bloomer M.D.   On: 10/29/2014 23:26   Mr Cervical Spine W Wo Contrast  10/29/2014   CLINICAL DATA:  Generalized weakness, follow-up head CT. History of colon cancer.  EXAM: MRI HEAD WITHOUT AND WITH CONTRAST  MRI CERVICAL SPINE WITHOUT AND WITH CONTRAST  TECHNIQUE: Multiplanar, multiecho pulse sequences of the brain and surrounding structures, and cervical spine, to include the craniocervical junction and cervicothoracic junction, were obtained without and with intravenous contrast.  CONTRAST:  34mL MULTIHANCE GADOBENATE DIMEGLUMINE 529 MG/ML IV SOLN  COMPARISON:  CT head October 28, 2014  FINDINGS: MRI HEAD FINDINGS  Moderate to severely motion degraded examination, patient received sedation. The post gadolinium sequences are severely motion degraded. Low T1, low T2 17 x 23 x 28 mm (transverse by AP by CC) homogeneously enhancing mass with ventral dural tail consistent with meningioma completely effaces the foramen magnum, laterally displacing the vertebral artery flow voids which are  present. The spinal cord is flattened, displaced to the RIGHT without cord edema. A second similar signal characteristic 6 x 11 mm mass arises from the RIGHT petrous apex, within the pre pontine cistern.  No reduced diffusion to suggest acute ischemia. No lobar hematoma. Motion degraded axial gradient sequences limit sensitivity for small hemorrhage. Ventricles and sulci are normal for age. Patchy supratentorial white matter T2 hyperintensities. No midline shift, mass effect or nor abnormal parenchymal enhancement/mass.  No abnormal extra-axial fluid collections. Normal major intracranial vascular flow voids seen at the skull base. Ocular globes and orbital contents are normal; status post bilateral ocular lens implants. Patient appears edentulous. LEFT maxillary mucosal retention cyst. The mastoid air cells are well aerated.  MRI CERVICAL SPINE FINDINGS  Severely motion degraded examination. Re- demonstration of foramen magnum meningioma. The vertebral bodies appear intact and aligned with maintenance of the cervical lordosis. Intervertebral discs demonstrate generally normal morphology, decreased T2 signal consistent with desiccation.  Mild canal stenosis.  No definite spinal cord edema, no syrinx.  Due to severely motion degraded axial sequences, limited assessment of individual levels.  IMPRESSION: Motion degraded examination, markedly limiting evaluation.  17 x 23 x 28 mm meningioma at the foramen magnum, resulting in severe effacement of the spinal cord, no definite cord edema and no syrinx though motion degrades evaluation.  6 x 11 mm meningioma RIGHT petrous apex/ pre pontine cistern.  No acute intracranial process. Involutional changes. Moderate white matter changes compatible with chronic small vessel ischemic disease.  No acute fracture malalignment of the cervical spine.   Electronically Signed   By: Elon Alas M.D.   On: 10/29/2014 23:26   Dg Hand 2 View Left  10/29/2014   CLINICAL DATA:  Left  hand numbness for 1 month. Left and swelling across metacarpals. Surgery to relieve pressure on the nerve of the hand 1 month ago.  EXAM: LEFT HAND - 2 VIEW  COMPARISON:  None.  FINDINGS: IV tubing overlies the wrist, obscuring detail. There is no evidence for acute fracture or dislocation. No soft tissue foreign body or gas identified.  IMPRESSION: No evidence for acute  abnormality.   Electronically Signed   By: Nolon Nations M.D.   On: 10/29/2014 14:42      No results found for: HGBA1C Lab Results  Component Value Date   CREATININE 0.63 10/29/2014       Scheduled Meds: . aspirin EC  81 mg Oral Daily  . [START ON 10/31/2014] calcium carbonate  1 tablet Oral Q breakfast  . cycloSPORINE  1 drop Both Eyes  BID  . dexamethasone  4 mg Intravenous 4 times per day  . diltiazem  30 mg Oral 3 times per day  . docusate sodium  300 mg Oral Daily  . dorzolamide-timolol  1 drop Left Eye BID  . finasteride  5 mg Oral Daily  . latanoprost  1 drop Left Eye QHS  . levothyroxine  100 mcg Oral QAC breakfast  . LORazepam  0.5 mg Oral QHS  . oxybutynin  5 mg Oral TID  . pilocarpine  1 drop Left Eye TID  . sodium chloride  3 mL Intravenous Q12H  . vancomycin  750 mg Intravenous Q12H  . cyanocobalamin  500 mcg Oral Daily   Continuous Infusions: . sodium chloride 75 mL/hr at 10/30/14 1033    Active Problems:   Weakness   Cellulitis    Time spent: 45 minutes   [facility] Pager [phone]. If 7PM-7AM, please contact night-coverage at [email or web address], password [facility] 10/30/2014, 10:33 AM  LOS: 2 days

## 2014-10-30 NOTE — Consult Note (Signed)
Reason for Consult:weakness Referring Physician: [name] is an 81 y.o. male.  HPI: 81 year old male who  has a past medical history of Glaucoma; Allergy; Anemia; Anxiety; Colon cancer (2003); and Hypothyroidism. On 10/28/14 was brought to the ED by EMS after a fall. Patient says that he was sitting in his computer room when he stood up to go out of the room and he may have had a misstep and then down on the floor on his buttocks. He denies loss of consciousness. Did not hit his head patient says that he could not get up from the sitting position until around 3 AM when he called a friend and called 911. Patient had carpal tunnel surgery and left hand on 09/28/2014 and was prescribed tramadol when necessary for pain which she has been taking occasionally. Patient says that he has been unsteady on his gait over the past few weeks, denies any dysarthria, no nausea vomiting or diarrhea. No chest pain shortness of breath. No history of seizures. No recent fever. Patient does have history of BPH and is followed by urology. In the ED patient found to have elevated CK of 960.  I was called today after MRI was obtained to evaluate patient for Generalized weakness due to cord compression MRI 17 x 23 x 28 mm meningioma at the foramen magnum, resulting in severe effacement of the spinal cord.  Patient has smaller meningioma of right Petrous Apex.  Per my discussion with Dr. Allyson Hall, we will start him on decadron , robaxin given hyperreflexia from cord compression  generalized weakness due to above , mild elevation of CK.  CK elevated, improving , Continue IV hydration. Follow CK Hold off on PT OT evaluation.   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     Past Medical History  Diagnosis Date  . Glaucoma   . Allergy   . Anemia   .  Anxiety   . Hypothyroidism   . Colon cancer 2003  . Complication of anesthesia     "had trouble waking me up after colonoscopy once"    Past Surgical History  Procedure Laterality Date  . Cataract extraction w/ intraocular lens  implant, bilateral Bilateral 2005  . Tear duct probing Bilateral 1990's    with tubes  . Colectomy  2003    "took 17in of my colon out; cancer"  . Carpal tunnel release Left 09/2014    Family History  Problem Relation Age of Onset  . Family history unknown: Yes    Social History:  reports that he quit smoking about 42 years ago. His smoking use included Cigarettes. He has a 15 pack-year smoking history. He has never used smokeless tobacco. He reports that he drinks alcohol. He reports that he does not use illicit drugs.  Allergies:  Allergies  Allergen Reactions  . Other Swelling    neptazine eye gtts  . Terbinafine And Related Swelling  . Codeine     REACTION: headache  . Fenofibrate Other (See Comments)    dizziness  . Sulfonamide Derivatives     REACTION: rash    Medications: I have reviewed the patient's current medications.  Results for orders placed or performed during the hospital encounter of 10/28/14 (from the past 48 hour(s))  CBC     Status: Abnormal   Collection Time: 10/29/14  3:53 AM  Result Value Ref Range   WBC 3.6 (L) 4.0 - 10.5 K/uL   RBC 3.27 (L) 4.22 - 5.81 MIL/uL   Hemoglobin 11.1 (L) 13.0 - 17.0 g/dL   HCT 32.7 (L) 39.0 - 52.0 %   MCV 100.0 78.0 - 100.0 fL   MCH 33.9 26.0 - 34.0 pg   MCHC 33.9 30.0 - 36.0 g/dL   RDW 14.3 11.5 - 15.5 %   Platelets 172 150 - 400 K/uL  Comprehensive metabolic panel     Status: Abnormal   Collection Time: 10/29/14  3:53 AM  Result Value Ref Range   Sodium 139 135 - 145 mmol/L   Potassium 3.7 3.5 - 5.1 mmol/L   Chloride 105 101 - 111 mmol/L   CO2 28 22 - 32 mmol/L   Glucose, Bld 110 (H) 65 - 99 mg/dL   BUN 10 6 - 20 mg/dL   Creatinine, Ser 0.63 0.61 - 1.24 mg/dL   Calcium 8.2 (L)  8.9 - 10.3 mg/dL   Total Protein 4.0 (L) 6.5 - 8.1 g/dL   Albumin 2.2 (L) 3.5 - 5.0 g/dL   AST 40 15 - 41 U/L   ALT 17 17 - 63 U/L   Alkaline Phosphatase 58 38 - 126 U/L   Total Bilirubin 1.3 (H) 0.3 - 1.2 mg/dL   GFR calc non Af Amer >60 >60 mL/min   GFR calc Af Amer >60 >60 mL/min    Comment: (NOTE) The eGFR has been calculated using the CKD EPI equation. This calculation has not been validated in all clinical situations. eGFR's persistently <60 mL/min signify possible Chronic Kidney Disease.    Anion gap 6 5 - 15  CK     Status: None   Collection Time: 10/29/14  1:00 PM  Result Value Ref Range   Total CK 384 49 - 397 U/L  TSH     Status: None   Collection Time: 10/29/14  1:00 PM  Result Value Ref Range   TSH 3.460 0.350 - 4.500 uIU/mL  Troponin I     Status: None   Collection Time: 10/29/14  2:15 PM  Result Value Ref Range   Troponin I <0.03 <0.031 ng/mL    Comment:        NO INDICATION OF MYOCARDIAL INJURY.   CK     Status: None   Collection Time: 10/30/14 12:14 AM  Result Value Ref Range   Total CK 332 49 - 397 U/L  CK     Status: Abnormal   Collection Time: 10/30/14 12:21 PM  Result Value Ref Range   Total CK 620 (H) 49 - 397 U/L    Mr [name] Hall Wo Contrast  10/29/2014   CLINICAL DATA:  Generalized weakness, follow-up head CT. History of colon cancer.  EXAM: MRI HEAD WITHOUT AND WITH CONTRAST  MRI CERVICAL SPINE WITHOUT AND WITH CONTRAST  TECHNIQUE: Multiplanar, multiecho pulse sequences of the brain and surrounding structures, and cervical spine, to include the craniocervical junction and cervicothoracic junction, were obtained without and with intravenous contrast.  CONTRAST:  58mL MULTIHANCE GADOBENATE DIMEGLUMINE 529 MG/ML IV SOLN  COMPARISON:  CT head October 28, 2014  FINDINGS: MRI HEAD FINDINGS  Moderate to severely motion degraded examination, patient received sedation. The post gadolinium sequences are severely motion degraded. Low T1, low T2 17 x 23 x 28 mm  (transverse by AP by CC) homogeneously enhancing mass with ventral dural tail consistent with meningioma completely effaces the foramen magnum, laterally displacing the vertebral artery  flow voids which are present. The spinal cord is flattened, displaced to the RIGHT without cord edema. A second similar signal characteristic 6 x 11 mm mass arises from the RIGHT petrous apex, within the pre pontine cistern.  No reduced diffusion to suggest acute ischemia. No lobar hematoma. Motion degraded axial gradient sequences limit sensitivity for small hemorrhage. Ventricles and sulci are normal for age. Patchy supratentorial white matter T2 hyperintensities. No midline shift, mass effect or nor abnormal parenchymal enhancement/mass.  No abnormal extra-axial fluid collections. Normal major intracranial vascular flow voids seen at the skull base. Ocular globes and orbital contents are normal; status post bilateral ocular lens implants. Patient appears edentulous. LEFT maxillary mucosal retention cyst. The mastoid air cells are well aerated.  MRI CERVICAL SPINE FINDINGS  Severely motion degraded examination. Re- demonstration of foramen magnum meningioma. The vertebral bodies appear intact and aligned with maintenance of the cervical lordosis. Intervertebral discs demonstrate generally normal morphology, decreased T2 signal consistent with desiccation.  Mild canal stenosis.  No definite spinal cord edema, no syrinx.  Due to severely motion degraded axial sequences, limited assessment of individual levels.  IMPRESSION: Motion degraded examination, markedly limiting evaluation.  17 x 23 x 28 mm meningioma at the foramen magnum, resulting in severe effacement of the spinal cord, no definite cord edema and no syrinx though motion degrades evaluation.  6 x 11 mm meningioma RIGHT petrous apex/ pre pontine cistern.  No acute intracranial process. Involutional changes. Moderate white matter changes compatible with chronic small vessel  ischemic disease.  No acute fracture malalignment of the cervical spine.   Electronically Signed   By: Elon Alas M.D.   On: 10/29/2014 23:26   Mr Cervical Spine W Wo Contrast  10/29/2014   CLINICAL DATA:  Generalized weakness, follow-up head CT. History of colon cancer.  EXAM: MRI HEAD WITHOUT AND WITH CONTRAST  MRI CERVICAL SPINE WITHOUT AND WITH CONTRAST  TECHNIQUE: Multiplanar, multiecho pulse sequences of the brain and surrounding structures, and cervical spine, to include the craniocervical junction and cervicothoracic junction, were obtained without and with intravenous contrast.  CONTRAST:  35mL MULTIHANCE GADOBENATE DIMEGLUMINE 529 MG/ML IV SOLN  COMPARISON:  CT head October 28, 2014  FINDINGS: MRI HEAD FINDINGS  Moderate to severely motion degraded examination, patient received sedation. The post gadolinium sequences are severely motion degraded. Low T1, low T2 17 x 23 x 28 mm (transverse by AP by CC) homogeneously enhancing mass with ventral dural tail consistent with meningioma completely effaces the foramen magnum, laterally displacing the vertebral artery flow voids which are present. The spinal cord is flattened, displaced to the RIGHT without cord edema. A second similar signal characteristic 6 x 11 mm mass arises from the RIGHT petrous apex, within the pre pontine cistern.  No reduced diffusion to suggest acute ischemia. No lobar hematoma. Motion degraded axial gradient sequences limit sensitivity for small hemorrhage. Ventricles and sulci are normal for age. Patchy supratentorial white matter T2 hyperintensities. No midline shift, mass effect or nor abnormal parenchymal enhancement/mass.  No abnormal extra-axial fluid collections. Normal major intracranial vascular flow voids seen at the skull base. Ocular globes and orbital contents are normal; status post bilateral ocular lens implants. Patient appears edentulous. LEFT maxillary mucosal retention cyst. The mastoid air cells are well  aerated.  MRI CERVICAL SPINE FINDINGS  Severely motion degraded examination. Re- demonstration of foramen magnum meningioma. The vertebral bodies appear intact and aligned with maintenance of the cervical lordosis. Intervertebral discs demonstrate generally normal morphology, decreased T2  signal consistent with desiccation.  Mild canal stenosis.  No definite spinal cord edema, no syrinx.  Due to severely motion degraded axial sequences, limited assessment of individual levels.  IMPRESSION: Motion degraded examination, markedly limiting evaluation.  17 x 23 x 28 mm meningioma at the foramen magnum, resulting in severe effacement of the spinal cord, no definite cord edema and no syrinx though motion degrades evaluation.  6 x 11 mm meningioma RIGHT petrous apex/ pre pontine cistern.  No acute intracranial process. Involutional changes. Moderate white matter changes compatible with chronic small vessel ischemic disease.  No acute fracture malalignment of the cervical spine.   Electronically Signed   By: Elon Alas M.D.   On: 10/29/2014 23:26   Dg Hand 2 View Left  10/29/2014   CLINICAL DATA:  Left hand numbness for 1 month. Left and swelling across metacarpals. Surgery to relieve pressure on the nerve of the hand 1 month ago.  EXAM: LEFT HAND - 2 VIEW  COMPARISON:  None.  FINDINGS: IV tubing overlies the wrist, obscuring detail. There is no evidence for acute fracture or dislocation. No soft tissue foreign body or gas identified.  IMPRESSION: No evidence for acute  abnormality.   Electronically Signed   By: Nolon Nations M.D.   On: 10/29/2014 14:42    Review of Systems - Negative except glaucoma, worsening of visual impairmnet and difficulty controlling his limbs.  Wife died 86 months ago.    Blood pressure 114/51, pulse 67, temperature 100.2 F (37.9 C), temperature source Oral, resp. rate 18, height $RemoveBe'5\' 10"'[id number]$  (1.778 m), weight 74.9 kg (165 lb 2 oz), SpO2 95 %. Physical Exam  Constitutional: He is  oriented to person, place, and time. He appears well-developed and well-nourished.  HENT:  Head: Normocephalic.  Keeps head tipped to the left, holds neck rigidly  Eyes: EOM are normal. Pupils are equal, round, and reactive to light.  Neck:  Poor ROM, tipped to left, does not bend neck  Neurological: He is alert and oriented to person, place, and time. No cranial nerve deficit. GCS eye subscore is 4. GCS verbal subscore is 5. GCS motor subscore is 6. He displays Babinski's sign on the right side. He displays Babinski's sign on the left side.  Reflex Scores:      Tricep reflexes are 3+ on the right side and 3+ on the left side.      Bicep reflexes are 3+ on the right side and 3+ on the left side.      Brachioradialis reflexes are 3+ on the right side.      Patellar reflexes are 3+ on the right side and 3+ on the left side.      Achilles reflexes are 3+ on the right side and 3+ on the left side. 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  I have recommended decadron and CT scan to better characterize this lesion.  Surgery is likely to be the only effective treatment for this lesion, but the patient is 75 and not in great health.  He says he wants to have surgery and said "what are we waiting for?"  I discussed the problem with him and his friend/companion.  Peggyann Shoals, MD 10/30/2014, 1:36 PM

## 2014-10-31 ENCOUNTER — Ambulatory Visit (HOSPITAL_BASED_OUTPATIENT_CLINIC_OR_DEPARTMENT_OTHER): Payer: Medicare Other

## 2014-10-31 DIAGNOSIS — I509 Heart failure, unspecified: Secondary | ICD-10-CM | POA: Diagnosis not present

## 2014-10-31 LAB — COMPREHENSIVE METABOLIC PANEL
ALT: 28 U/L (ref 17–63)
AST: 51 U/L — ABNORMAL HIGH (ref 15–41)
Albumin: 2.4 g/dL — ABNORMAL LOW (ref 3.5–5.0)
Alkaline Phosphatase: 48 U/L (ref 38–126)
Anion gap: 4 — ABNORMAL LOW (ref 5–15)
BUN: 10 mg/dL (ref 6–20)
CALCIUM: 7.9 mg/dL — AB (ref 8.9–10.3)
CO2: 23 mmol/L (ref 22–32)
Chloride: 106 mmol/L (ref 101–111)
Creatinine, Ser: 0.5 mg/dL — ABNORMAL LOW (ref 0.61–1.24)
GLUCOSE: 129 mg/dL — AB (ref 65–99)
Potassium: 4.1 mmol/L (ref 3.5–5.1)
Sodium: 133 mmol/L — ABNORMAL LOW (ref 135–145)
Total Bilirubin: 1 mg/dL (ref 0.3–1.2)
Total Protein: 4.4 g/dL — ABNORMAL LOW (ref 6.5–8.1)

## 2014-10-31 LAB — CBC
HCT: 30.7 % — ABNORMAL LOW (ref 39.0–52.0)
Hemoglobin: 10.8 g/dL — ABNORMAL LOW (ref 13.0–17.0)
MCH: 34.2 pg — AB (ref 26.0–34.0)
MCHC: 35.2 g/dL (ref 30.0–36.0)
MCV: 97.2 fL (ref 78.0–100.0)
Platelets: 172 10*3/uL (ref 150–400)
RBC: 3.16 MIL/uL — ABNORMAL LOW (ref 4.22–5.81)
RDW: 13.7 % (ref 11.5–15.5)
WBC: 5.6 10*3/uL (ref 4.0–10.5)

## 2014-10-31 LAB — URIC ACID: URIC ACID, SERUM: 2.4 mg/dL — AB (ref 4.4–7.6)

## 2014-10-31 MED ORDER — VANCOMYCIN HCL IN DEXTROSE 750-5 MG/150ML-% IV SOLN: 750.0000 mg | Freq: Two times a day (BID) | INTRAVENOUS | Status: AC

## 2014-10-31 MED ORDER — DILTIAZEM HCL 30 MG PO TABS: 30.0000 mg | ORAL_TABLET | Freq: Three times a day (TID) | ORAL | Status: AC

## 2014-10-31 MED ORDER — METHOCARBAMOL 1000 MG/10ML IJ SOLN: 500.0000 mg | Freq: Three times a day (TID) | INTRAVENOUS | Status: AC | PRN

## 2014-10-31 MED ORDER — DEXAMETHASONE SODIUM PHOSPHATE 4 MG/ML IJ SOLN: 4.0000 mg | Freq: Four times a day (QID) | INTRAMUSCULAR | Status: AC

## 2014-10-31 NOTE — Discharge Summary (Addendum)
Physician Discharge Summary  [name] Hall MRN: [id number] DOB/AGE: 1932/08/09 81 y.o.  PCP: Jerlyn Ly, MD   Admit date: 10/28/2014 Discharge date: 10/31/2014  Discharge Diagnoses:     Active Problems:   Weakness   Cellulitis large foramen magnum meningioma  Cervicomedullary compression Mild rhabdomyolysis PAC's     Follow-up recommendations  Patient being transferred to [facility] neurosurgery service for management of his meningioma    Medication List    TAKE these medications        aspirin 81 MG tablet  Take 81 mg by mouth. Takes once a week     calcium carbonate 600 MG Tabs tablet  Commonly known as:  OS-CAL  Take 600 mg by mouth daily with breakfast.     cholecalciferol 1000 UNITS tablet  Commonly known as:  VITAMIN D  Take 1,000 Units by mouth daily.     cyanocobalamin 500 MCG tablet  Take 500 mcg by mouth daily.     dexamethasone 4 MG/ML injection  Commonly known as:  DECADRON  Inject 1 mL (4 mg total) into the vein every 6 (six) hours.     diltiazem 30 MG tablet  Commonly known as:  CARDIZEM  Take 1 tablet (30 mg total) by mouth every 8 (eight) hours.     docusate sodium 100 MG capsule  Commonly known as:  COLACE  Take 300 mg by mouth daily.     dorzolamide-timolol 22.3-6.8 MG/ML ophthalmic solution  Commonly known as:  COSOPT  Place 1 drop into the left eye 2 (two) times daily.     finasteride 5 MG tablet  Commonly known as:  PROSCAR  Take 5 mg by mouth daily.     ibandronate 150 MG tablet  Commonly known as:  BONIVA  TAKE ONE TAB once A MONTH     levothyroxine 100 MCG tablet  Commonly known as:  SYNTHROID, LEVOTHROID  Take 100 mcg by mouth daily before breakfast.     LORazepam 1 MG tablet  Commonly known as:  ATIVAN  Take 1 mg by mouth at bedtime.     methocarbamol 500 mg in dextrose 5 % 50 mL  Inject 500 mg into the vein every 8 (eight) hours as needed.     multivitamin tablet  Take 1 tablet by mouth daily.      oxybutynin 5 MG tablet  Commonly known as:  DITROPAN  Take 5 mg by mouth 3 (three) times daily.     pilocarpine 1 % ophthalmic solution  Commonly known as:  PILOCAR  Place 1 drop into the left eye 3 (three) times daily.     RESTASIS OP  Apply 1 drop to eye 2 (two) times daily.     Vancomycin 750 MG/150ML Soln  Commonly known as:  VANCOCIN  Inject 150 mLs (750 mg total) into the vein every 12 (twelve) hours.     Vitamin B1-B12 5.5-0.0125 MG/5ML Soln  Inject 1 application into the muscle every 30 (thirty) days.         Discharge Condition: Stable  Disposition:    Consults:  Neurosurgery     Significant Diagnostic Studies:  Dg Pelvis 1-2 Views  10/28/2014   CLINICAL DATA:  Pain following fall  EXAM: PELVIS - 1-2 VIEW  COMPARISON:  None.  FINDINGS: There is no evidence of pelvic fracture or dislocation. There is mild symmetric narrowing of both hip joints. There are foci of arterial vascular calcification.  IMPRESSION: Symmetric narrowing of both hip joints. No acute  fracture or dislocation.   Electronically Signed   By: Lowella Grip III M.D.   On: 10/28/2014 07:56   Ct Head Wo Contrast  10/28/2014   CLINICAL DATA:  Fall at approximately 8 p.m. last evening. Unable to get up. He called EMS at 3:30 a.m. Bilateral lower extremity weakness. Incontinent.  EXAM: CT HEAD WITHOUT CONTRAST  TECHNIQUE: Contiguous axial images were obtained from the base of the skull through the vertex without intravenous contrast.  COMPARISON:  PET scan 11/17/2006.  FINDINGS: A calcified intra-axial lesion at the craniocervical junction is slightly larger in size than on the prior study. It now measures 13 x 8 mm. High density of the anterior and right lateral dura versus is a small amount of subdural hemorrhage is noted. There was some density of the dura previously.  Moderate generalized atrophy and white matter disease is present bilaterally. No other focal hemorrhage is present. The ventricles are  proportionate to the degree of atrophy. No significant extraaxial fluid collection is present.  No significant extracranial soft tissue injury is present. Bilateral lens replacements are noted. The globes and orbits are otherwise intact. The calvarium is normal. Polyps or mucous retention cysts are again noted a within the left greater than right maxillary sinus. The paranasal sinuses and mastoid air cells are otherwise clear.  IMPRESSION: 1. Calcified intra-axial lesion at the craniocervical junction is slightly larger than on the prior study. This may represent a cavernous hemangioma. Neoplasm or metastatic disease is considered unlikely given the relative stability over 10 years. 2. Dural thickening versus small subdural hemorrhage anterior to the spinal cord and medulla. There is some calcification of the dura. 3. The combination could represent a cavernous hemangioma with hemorrhage versus other vascular malformation. Recommend MRI of the brain and cervical spine without and with contrast for further evaluation. 4. Moderate generalized atrophy and white matter disease bilaterally likely reflects the sequela of chronic microvascular ischemia. 5. No other acute abnormality. These results were called by telephone at the time of interpretation on 10/28/2014 at 11:07 am to Dr. Eleonore Chiquito , who verbally acknowledged these results.   Electronically Signed   By: San Morelle M.D.   On: 10/28/2014 11:07   Ct Head W Wo Contrast  10/30/2014   CLINICAL DATA:  Meningioma at the foramen magnum.  EXAM: CT HEAD WITHOUT AND WITH CONTRAST  CT CERVICAL SPINE WITH CONTRAST  TECHNIQUE: Contiguous axial images were obtained from the base of the skull through the vertex without and with intravenous contrast.  Multidetector CT imaging of the cervical spine was performed with intravenous contrast. Multiplanar CT image reconstructions of the cervical spine were also generated.  COMPARISON:  Brain and cervical spine MRI  10/29/2014  FINDINGS: CT HEAD FINDINGS  Stealth protocol for surgical planning.  Mild motion artifact.  There is no evidence of acute large territory infarct, intracranial hemorrhage, midline shift, or extra-axial fluid collection. There is mild generalized cerebral atrophy with mild chronic small vessel ischemic disease in the cerebral white matter.  Mastoid air cells are clear. Left maxillary sinus mucous retention cysts are noted.  11 mm homogeneously enhancing extra-axial mass is again seen in the prepontine cistern near the right petrous apex and posterior aspect of the cavernous sinus.  Enhancing, partially calcified extra-axial mass in the ventral aspect of the foramen magnum measures 2.0 x 1.8 x 2.9 cm (AP by transverse by craniocaudal). As described on recent MRI, this displaces the vertebral arteries laterally, and it posteriorly and right laterally  displaces and severely compresses the cervicomedullary junction. The mass extends caudally to the level of the base of the dens.  CT CERVICAL SPINE FINDINGS  The study is mildly motion degraded at the skullbase and in the upper cervical spine.  There is exaggeration of the normal cervical lordosis. There is no significant listhesis. Extra-axial mass in the ventral foramen magnum as above. Vertebral body heights are preserved. 10 mm densely sclerotic focus in the right aspect of C2 may represent a bone island.  Intervertebral disc space heights overall appear relatively well preserved. Mild multilevel endplate spurring is noted. There is moderate left-sided facet arthrosis at C4-5. Disc bulging from C3-4 to C5-6 results in mild-to-moderate spinal stenosis. Mild-to-moderate bilateral neural foraminal stenosis is also present at these levels.  No cervical spine fracture is identified. Minimal scarring is noted in the lung apices. Moderate atherosclerotic vascular calcification is noted, particularly about the carotid bifurcations.  IMPRESSION: 1. 2.9 cm  extra-axial mass ventrally in the foramen magnum consistent with a meningioma as previously described. Severe compression of the cervicomedullary junction. 2. 11 mm right petrous apex/pre pontine cistern a meningioma. 3. No evidence of acute intracranial abnormality. 4. Mild cervical spondylosis with mild to moderate spinal canal and neural foraminal narrowing.   Electronically Signed   By: Logan Bores   On: 10/30/2014 16:44   Ct Cervical Spine W Contrast  10/30/2014   CLINICAL DATA:  Meningioma at the foramen magnum.  EXAM: CT HEAD WITHOUT AND WITH CONTRAST  CT CERVICAL SPINE WITH CONTRAST  TECHNIQUE: Contiguous axial images were obtained from the base of the skull through the vertex without and with intravenous contrast.  Multidetector CT imaging of the cervical spine was performed with intravenous contrast. Multiplanar CT image reconstructions of the cervical spine were also generated.  COMPARISON:  Brain and cervical spine MRI 10/29/2014  FINDINGS: CT HEAD FINDINGS  Stealth protocol for surgical planning.  Mild motion artifact.  There is no evidence of acute large territory infarct, intracranial hemorrhage, midline shift, or extra-axial fluid collection. There is mild generalized cerebral atrophy with mild chronic small vessel ischemic disease in the cerebral white matter.  Mastoid air cells are clear. Left maxillary sinus mucous retention cysts are noted.  11 mm homogeneously enhancing extra-axial mass is again seen in the prepontine cistern near the right petrous apex and posterior aspect of the cavernous sinus.  Enhancing, partially calcified extra-axial mass in the ventral aspect of the foramen magnum measures 2.0 x 1.8 x 2.9 cm (AP by transverse by craniocaudal). As described on recent MRI, this displaces the vertebral arteries laterally, and it posteriorly and right laterally displaces and severely compresses the cervicomedullary junction. The mass extends caudally to the level of the base of the dens.   CT CERVICAL SPINE FINDINGS  The study is mildly motion degraded at the skullbase and in the upper cervical spine.  There is exaggeration of the normal cervical lordosis. There is no significant listhesis. Extra-axial mass in the ventral foramen magnum as above. Vertebral body heights are preserved. 10 mm densely sclerotic focus in the right aspect of C2 may represent a bone island.  Intervertebral disc space heights overall appear relatively well preserved. Mild multilevel endplate spurring is noted. There is moderate left-sided facet arthrosis at C4-5. Disc bulging from C3-4 to C5-6 results in mild-to-moderate spinal stenosis. Mild-to-moderate bilateral neural foraminal stenosis is also present at these levels.  No cervical spine fracture is identified. Minimal scarring is noted in the lung apices. Moderate atherosclerotic vascular  calcification is noted, particularly about the carotid bifurcations.  IMPRESSION: 1. 2.9 cm extra-axial mass ventrally in the foramen magnum consistent with a meningioma as previously described. Severe compression of the cervicomedullary junction. 2. 11 mm right petrous apex/pre pontine cistern a meningioma. 3. No evidence of acute intracranial abnormality. 4. Mild cervical spondylosis with mild to moderate spinal canal and neural foraminal narrowing.   Electronically Signed   By: Logan Bores   On: 10/30/2014 16:44   Mr Brain W Wo Contrast  10/29/2014   CLINICAL DATA:  Generalized weakness, follow-up head CT. History of colon cancer.  EXAM: MRI HEAD WITHOUT AND WITH CONTRAST  MRI CERVICAL SPINE WITHOUT AND WITH CONTRAST  TECHNIQUE: Multiplanar, multiecho pulse sequences of the brain and surrounding structures, and cervical spine, to include the craniocervical junction and cervicothoracic junction, were obtained without and with intravenous contrast.  CONTRAST:  27mL MULTIHANCE GADOBENATE DIMEGLUMINE 529 MG/ML IV SOLN  COMPARISON:  CT head October 28, 2014  FINDINGS: MRI HEAD FINDINGS   Moderate to severely motion degraded examination, patient received sedation. The post gadolinium sequences are severely motion degraded. Low T1, low T2 17 x 23 x 28 mm (transverse by AP by CC) homogeneously enhancing mass with ventral dural tail consistent with meningioma completely effaces the foramen magnum, laterally displacing the vertebral artery flow voids which are present. The spinal cord is flattened, displaced to the RIGHT without cord edema. A second similar signal characteristic 6 x 11 mm mass arises from the RIGHT petrous apex, within the pre pontine cistern.  No reduced diffusion to suggest acute ischemia. No lobar hematoma. Motion degraded axial gradient sequences limit sensitivity for small hemorrhage. Ventricles and sulci are normal for age. Patchy supratentorial white matter T2 hyperintensities. No midline shift, mass effect or nor abnormal parenchymal enhancement/mass.  No abnormal extra-axial fluid collections. Normal major intracranial vascular flow voids seen at the skull base. Ocular globes and orbital contents are normal; status post bilateral ocular lens implants. Patient appears edentulous. LEFT maxillary mucosal retention cyst. The mastoid air cells are well aerated.  MRI CERVICAL SPINE FINDINGS  Severely motion degraded examination. Re- demonstration of foramen magnum meningioma. The vertebral bodies appear intact and aligned with maintenance of the cervical lordosis. Intervertebral discs demonstrate generally normal morphology, decreased T2 signal consistent with desiccation.  Mild canal stenosis.  No definite spinal cord edema, no syrinx.  Due to severely motion degraded axial sequences, limited assessment of individual levels.  IMPRESSION: Motion degraded examination, markedly limiting evaluation.  17 x 23 x 28 mm meningioma at the foramen magnum, resulting in severe effacement of the spinal cord, no definite cord edema and no syrinx though motion degrades evaluation.  6 x 11 mm  meningioma RIGHT petrous apex/ pre pontine cistern.  No acute intracranial process. Involutional changes. Moderate white matter changes compatible with chronic small vessel ischemic disease.  No acute fracture malalignment of the cervical spine.   Electronically Signed   By: Elon Alas M.D.   On: 10/29/2014 23:26   Mr Cervical Spine W Wo Contrast  10/29/2014   CLINICAL DATA:  Generalized weakness, follow-up head CT. History of colon cancer.  EXAM: MRI HEAD WITHOUT AND WITH CONTRAST  MRI CERVICAL SPINE WITHOUT AND WITH CONTRAST  TECHNIQUE: Multiplanar, multiecho pulse sequences of the brain and surrounding structures, and cervical spine, to include the craniocervical junction and cervicothoracic junction, were obtained without and with intravenous contrast.  CONTRAST:  59mL MULTIHANCE GADOBENATE DIMEGLUMINE 529 MG/ML IV SOLN  COMPARISON:  CT head  October 28, 2014  FINDINGS: MRI HEAD FINDINGS  Moderate to severely motion degraded examination, patient received sedation. The post gadolinium sequences are severely motion degraded. Low T1, low T2 17 x 23 x 28 mm (transverse by AP by CC) homogeneously enhancing mass with ventral dural tail consistent with meningioma completely effaces the foramen magnum, laterally displacing the vertebral artery flow voids which are present. The spinal cord is flattened, displaced to the RIGHT without cord edema. A second similar signal characteristic 6 x 11 mm mass arises from the RIGHT petrous apex, within the pre pontine cistern.  No reduced diffusion to suggest acute ischemia. No lobar hematoma. Motion degraded axial gradient sequences limit sensitivity for small hemorrhage. Ventricles and sulci are normal for age. Patchy supratentorial white matter T2 hyperintensities. No midline shift, mass effect or nor abnormal parenchymal enhancement/mass.  No abnormal extra-axial fluid collections. Normal major intracranial vascular flow voids seen at the skull base. Ocular globes and  orbital contents are normal; status post bilateral ocular lens implants. Patient appears edentulous. LEFT maxillary mucosal retention cyst. The mastoid air cells are well aerated.  MRI CERVICAL SPINE FINDINGS  Severely motion degraded examination. Re- demonstration of foramen magnum meningioma. The vertebral bodies appear intact and aligned with maintenance of the cervical lordosis. Intervertebral discs demonstrate generally normal morphology, decreased T2 signal consistent with desiccation.  Mild canal stenosis.  No definite spinal cord edema, no syrinx.  Due to severely motion degraded axial sequences, limited assessment of individual levels.  IMPRESSION: Motion degraded examination, markedly limiting evaluation.  17 x 23 x 28 mm meningioma at the foramen magnum, resulting in severe effacement of the spinal cord, no definite cord edema and no syrinx though motion degrades evaluation.  6 x 11 mm meningioma RIGHT petrous apex/ pre pontine cistern.  No acute intracranial process. Involutional changes. Moderate white matter changes compatible with chronic small vessel ischemic disease.  No acute fracture malalignment of the cervical spine.   Electronically Signed   By: Elon Alas M.D.   On: 10/29/2014 23:26   Dg Hand 2 View Left  10/29/2014   CLINICAL DATA:  Left hand numbness for 1 month. Left and swelling across metacarpals. Surgery to relieve pressure on the nerve of the hand 1 month ago.  EXAM: LEFT HAND - 2 VIEW  COMPARISON:  None.  FINDINGS: IV tubing overlies the wrist, obscuring detail. There is no evidence for acute fracture or dislocation. No soft tissue foreign body or gas identified.  IMPRESSION: No evidence for acute  abnormality.   Electronically Signed   By: Nolon Nations M.D.   On: 10/29/2014 14:42    Echocardiogram LV EF: 50% -  55%  ------------------------------------------------------------------- Indications:   Dyspnea  786.09.  ------------------------------------------------------------------- Study Conclusions  - Left ventricle: The cavity size was normal. Wall thickness was increased in a pattern of mild LVH. There was mild focal basal hypertrophy of the septum. Systolic function was normal. The estimated ejection fraction was in the range of 50% to 55%. Wall motion was normal; there were no regional wall motion abnormalities. Doppler parameters are consistent with abnormal left ventricular relaxation (grade 1 diastolic dysfunction).   Filed Weights   10/28/14 0514 10/28/14 1000  Weight: 77.111 kg (170 lb) 74.9 kg (165 lb 2 oz)     Microbiology: Recent Results (from the past 240 hour(s))  Culture, blood (routine x 2)     Status: None (Preliminary result)   Collection Time: 10/28/14 11:40 AM  Result Value Ref Range Status  Specimen Description BLOOD RIGHT ANTECUBITAL  Final   Special Requests BOTTLES DRAWN AEROBIC AND ANAEROBIC 5CC  Final   Culture NO GROWTH 2 DAYS  Final   Report Status PENDING  Incomplete  Culture, blood (routine x 2)     Status: None (Preliminary result)   Collection Time: 10/28/14 11:58 AM  Result Value Ref Range Status   Specimen Description BLOOD RIGHT HAND  Final   Special Requests BOTTLES DRAWN AEROBIC ONLY 6CC  Final   Culture NO GROWTH 2 DAYS  Final   Report Status PENDING  Incomplete       Blood Culture    Component Value Date/Time   SDES BLOOD RIGHT HAND 10/28/2014 1158   SPECREQUEST BOTTLES DRAWN AEROBIC ONLY 6CC 10/28/2014 1158   CULT NO GROWTH 2 DAYS 10/28/2014 1158   REPTSTATUS PENDING 10/28/2014 1158      Labs: Results for orders placed or performed during the hospital encounter of 10/28/14 (from the past 48 hour(s))  CK     Status: None   Collection Time: 10/29/14  1:00 PM  Result Value Ref Range   Total CK 384 49 - 397 U/L  TSH     Status: None   Collection Time: 10/29/14  1:00 PM  Result Value Ref Range   TSH 3.460 0.350  - 4.500 uIU/mL  Troponin I     Status: None   Collection Time: 10/29/14  2:15 PM  Result Value Ref Range   Troponin I <0.03 <0.031 ng/mL    Comment:        NO INDICATION OF MYOCARDIAL INJURY.   CK     Status: None   Collection Time: 10/30/14 12:14 AM  Result Value Ref Range   Total CK 332 49 - 397 U/L  CK     Status: Abnormal   Collection Time: 10/30/14 12:21 PM  Result Value Ref Range   Total CK 620 (H) 49 - 397 U/L  Uric acid     Status: Abnormal   Collection Time: 10/31/14  5:37 AM  Result Value Ref Range   Uric Acid, Serum 2.4 (L) 4.4 - 7.6 mg/dL  CBC     Status: Abnormal   Collection Time: 10/31/14  5:37 AM  Result Value Ref Range   WBC 5.6 4.0 - 10.5 K/uL   RBC 3.16 (L) 4.22 - 5.81 MIL/uL   Hemoglobin 10.8 (L) 13.0 - 17.0 g/dL   HCT 30.7 (L) 39.0 - 52.0 %   MCV 97.2 78.0 - 100.0 fL   MCH 34.2 (H) 26.0 - 34.0 pg   MCHC 35.2 30.0 - 36.0 g/dL   RDW 13.7 11.5 - 15.5 %   Platelets 172 150 - 400 K/uL  Comprehensive metabolic panel     Status: Abnormal   Collection Time: 10/31/14  5:37 AM  Result Value Ref Range   Sodium 133 (L) 135 - 145 mmol/L   Potassium 4.1 3.5 - 5.1 mmol/L   Chloride 106 101 - 111 mmol/L   CO2 23 22 - 32 mmol/L   Glucose, Bld 129 (H) 65 - 99 mg/dL   BUN 10 6 - 20 mg/dL   Creatinine, Ser 0.50 (L) 0.61 - 1.24 mg/dL   Calcium 7.9 (L) 8.9 - 10.3 mg/dL   Total Protein 4.4 (L) 6.5 - 8.1 g/dL   Albumin 2.4 (L) 3.5 - 5.0 g/dL   AST 51 (H) 15 - 41 U/L   ALT 28 17 - 63 U/L   Alkaline Phosphatase 48 38 - 126  U/L   Total Bilirubin 1.0 0.3 - 1.2 mg/dL   GFR calc non Af Amer >60 >60 mL/min   GFR calc Af Amer >60 >60 mL/min    Comment: (NOTE) The eGFR has been calculated using the CKD EPI equation. This calculation has not been validated in all clinical situations. eGFR's persistently <60 mL/min signify possible Chronic Kidney Disease.    Anion gap 4 (L) 5 - 15     Lipid Panel  No results found for: CHOL, TRIG, HDL, CHOLHDL, VLDL, LDLCALC,  LDLDIRECT   No results found for: HGBA1C   Lab Results  Component Value Date   CREATININE 0.50* 10/31/2014     HPI :81 y.o. male.  HPI: 81 year old male who has a past medical history of Glaucoma; Allergy; Anemia; Anxiety; Colon cancer (2003); and Hypothyroidism. On 10/28/14 was brought to the ED by EMS after a fall. Patient says that he was sitting in his computer room when he stood up to go out of the room and he may have had a misstep and then down on the floor on his buttocks. He denies loss of consciousness. Did not hit his head patient says that he could not get up from the sitting position until around 3 AM when he called a friend and called 911. Patient had carpal tunnel surgery and left hand on 09/28/2014 and was prescribed tramadol when necessary for pain which she has been taking occasionally. Patient says that he has been unsteady on his gait over the past few weeks, denies any dysarthria, no nausea vomiting or diarrhea. No chest pain shortness of breath. No history of seizures. No recent fever. Patient does have history of BPH and is followed by urology. In the ED patient found to have elevated CK of 960.  Neurosurgery was called today after MRI was obtained to evaluate patient for Generalized weakness due to cord compression MRI 17 x 23 x 28 mm meningioma at the foramen magnum, resulting in severe effacement of the spinal cord. Patient has smaller meningioma of right Petrous Apex.    HOSPITAL COURSE: Generalized weakness due to cord compressiom MRI 17 x 23 x 28 mm meningioma at the foramen magnum, resulting in severe effacement of the spinal cord, neurosurgery consulted ,Dr Donald Pore Will start him on decadron , robaxin given hyperreflexia from cord compression  generalized weakness due to above , mild elevation of CK.  CK elevated, improving , Continue IV hydration.    patient evaluated by neurosurgery, seen by Dr Baird Kay , neurosurgery Dr. Donald Pore recommended  starting the patient on Decadron and transferring the patient to a tertiary care center for neurosurgical support, No other cause of weakness was found,    Left upper extremity pain Patient has left lower extremity erythema and warmth, tenderness to palpation. Venous Doppler shows negative for DVT.  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 Continue vancomycin per pharmacy consultation for possible cellulitis.  Irregular heartbeat, EKG documents PACs, no evidence of atrial fibrillation Echocardiogram completed, results as above, continue cardiac monitoring , continue PO cardizem   DVT prophylaxis Start Lovenox for DVT prophylaxis  Discharge Exam:    Blood pressure 145/60, pulse 68, temperature 98.8 F (37.1 C), temperature source Oral, resp. rate 16, height $RemoveBe'5\' 10"'[id number]$  (1.778 m), weight 74.9 kg (165 lb 2 oz), SpO2 95 %.  General: No acute respiratory distress Lungs: Clear to auscultation bilaterally without wheezes or crackles Cardiovascular: Regular rate and rhythm without murmur gallop or rub normal S1 and S2  Abdomen: Nontender, nondistended, soft, bowel sounds positive, no rebound, no ascites, no appreciable mass Extremities: No significant cyanosis, clubbing, or edema bilateral lower extremities       Discharge Instructions    Diet - low sodium heart healthy    Complete by:  As directed      Increase activity slowly    Complete by:  As directed              Signed: Jenean Escandon 10/31/2014, 12:21 PM        Time spent >45 mins

## 2014-10-31 NOTE — Progress Notes (Signed)
Report called to [place] at [facility] on [address]

## 2014-10-31 NOTE — Progress Notes (Addendum)
ANTIBIOTIC CONSULT NOTE  Pharmacy Consult for vancomycin Indication: cellulitis  Allergies  Allergen Reactions  . Other Swelling    neptazine eye gtts  . Terbinafine And Related Swelling  . Codeine     REACTION: headache  . Fenofibrate Other (See Comments)    dizziness  . Sulfonamide Derivatives     REACTION: rash    Patient Measurements: Height: 5\' 10"  (177.8 cm) Weight: 165 lb 2 oz (74.9 kg) IBW/kg (Calculated) : 73   Vital Signs: Temp: 98.8 F (37.1 C) (06/27 0544) Temp Source: Oral (06/27 0544) BP: 145/60 mmHg (06/27 0544) Pulse Rate: 68 (06/27 0544) Intake/Output from previous day: 06/26 0701 - 06/27 0700 In: 900 [I.V.:900] Out: -  Intake/Output from this shift: Total I/O In: 240 [P.O.:240] Out: -   Labs:  Recent Labs  10/29/14 0353 10/31/14 0537  WBC 3.6* 5.6  HGB 11.1* 10.8*  PLT 172 172  CREATININE 0.63 0.50*   Estimated Creatinine Clearance: 77.3 mL/min (by C-G formula based on Cr of 0.5). No results for input(s): VANCOTROUGH, VANCOPEAK, VANCORANDOM, GENTTROUGH, GENTPEAK, GENTRANDOM, TOBRATROUGH, TOBRAPEAK, TOBRARND, AMIKACINPEAK, AMIKACINTROU, AMIKACIN in the last 72 hours.   Microbiology: Recent Results (from the past 720 hour(s))  Culture, blood (routine x 2)     Status: None (Preliminary result)   Collection Time: 10/28/14 11:40 AM  Result Value Ref Range Status   Specimen Description BLOOD RIGHT ANTECUBITAL  Final   Special Requests BOTTLES DRAWN AEROBIC AND ANAEROBIC 5CC  Final   Culture NO GROWTH 2 DAYS  Final   Report Status PENDING  Incomplete  Culture, blood (routine x 2)     Status: None (Preliminary result)   Collection Time: 10/28/14 11:58 AM  Result Value Ref Range Status   Specimen Description BLOOD RIGHT HAND  Final   Special Requests BOTTLES DRAWN AEROBIC ONLY [place]  Final   Culture NO GROWTH 2 DAYS  Final   Report Status PENDING  Incomplete    Medical History: Past Medical History  Diagnosis Date  . Glaucoma   .  Allergy   . Anemia   . Anxiety   . Hypothyroidism   . Colon cancer 2003  . Complication of anesthesia     "had trouble waking me up after colonoscopy once"    Assessment: 81 YOM continuing on vancomycin for cellulitis. WBC wnl, tmax/24h 100.9, currently afeb. Renal function stable. SCr 0.5 with est CrCl ~75-82mL/min. Patient to transfer to [facility] today per MD discharge note for neurosurgery to manage meningioma.  Vanc 6/24>>  6/24 BCx: NGTD  Goal of Therapy:  Vancomycin trough level 10-15 mcg/ml  Plan:  -vancomycin 750mg  IV q12h -follow renal function, c/s, clinical progression, trough PRN -if uncomplicated cellulitis, recommend PO doxycyline (patient has allergy to sulfonamides, so Bactrim would not be appropriate) -Patient to transfer to [facility] today per MD discharge note for neurosurgery to manage meningioma  Elicia Lamp, PharmD Clinical Pharmacist - Resident Pager [phone] 10/31/2014 12:43 PM

## 2014-10-31 NOTE — Progress Notes (Addendum)
MD notified that patient has a bed at [facility], [facility] and has to be there by midnight.  Room number 8 [address]- Neurosurgery Number to call report [phone]- RN spoke with Claiborne Billings

## 2014-10-31 NOTE — Progress Notes (Signed)
OT Cancellation Note  Patient Details Name: [name] Hall MRN: [id number] DOB: 1933/04/24   Cancelled Treatment:    Reason Eval/Treat Not Completed: Patient not medically ready MRI revealed 17 x 23 x 28 mm meningioma at the foramen magnum and 6 x 11 mm meningioma RIGHT petrous apex/ pre pontine cistern. Neurosurgery asked per their note to hold off on PT/OT yesterday. Called Pt's RN and she reports that they are trying to get pt transferred to [facility]. Will continue to hold off on OT eval today as well.  Almon Register [phone] 10/31/2014, 11:27 AM

## 2014-10-31 NOTE — Progress Notes (Signed)
  Echocardiogram 2D Echocardiogram has been performed.  Jennette Dubin 10/31/2014, 12:43 PM

## 2014-10-31 NOTE — Care Management (Signed)
Important Message  Patient Details  Name: [name] Hall MRN: [id number] Date of Birth: 04-10-1933   Medicare Important Message Given:  Yes-second notification given    Delorse Lek 10/31/2014, 1:11 PM

## 2014-10-31 NOTE — Progress Notes (Signed)
PT Cancellation Note  Patient Details Name: [name] Hall MRN: [id number] DOB: [birth year]   Cancelled Treatment:    Reason Eval/Treat Not Completed: Patient not medically ready Patient not medically ready MRI revealed 17 x 23 x 28 mm meningioma at the foramen magnum and 6 x 11 mm meningioma RIGHT petrous apex/ pre pontine cistern. Neurosurgery asked per their note to hold off on PT/OT yesterday. Called Pt's RN and she reports that they are trying to get pt transferred to [facility]. Will continue to hold off on PT eval today as well.   Kingsley Callander 10/31/2014, 11:40 AM   Kittie Plater, PT, DPT Pager #: [phone] Office #: [phone]

## 2014-11-02 LAB — CULTURE, BLOOD (ROUTINE X 2)
CULTURE: NO GROWTH
Culture: NO GROWTH

## 2015-02-04 DEATH — deceased

## 2015-03-07 DEATH — deceased

## 2017-03-29 IMAGING — MR MR CERVICAL SPINE WO/W CM
10 of 20 series · 18 of 48 positions shown · IV contrast (15)
Comparison: CT head October 28, 2014

CLINICAL DATA: Generalized weakness, follow-up head CT. History of
colon cancer.

EXAM:
MRI HEAD WITHOUT AND WITH CONTRAST
MRI CERVICAL SPINE WITHOUT AND WITH CONTRAST
TECHNIQUE: Multiplanar, multiecho pulse sequences of the brain and surrounding
structures, and cervical spine, to include the craniocervical
junction and cervicothoracic junction, were obtained without and
with intravenous contrast.
CONTRAST:  15mL MULTIHANCE GADOBENATE DIMEGLUMINE 529 MG/ML IV SOLN

[Series 8: T1 · sagittal · 5.0mm · 0.47mm/px · 3 of 27 slices shown (1 of 4)]
[im 1/27]
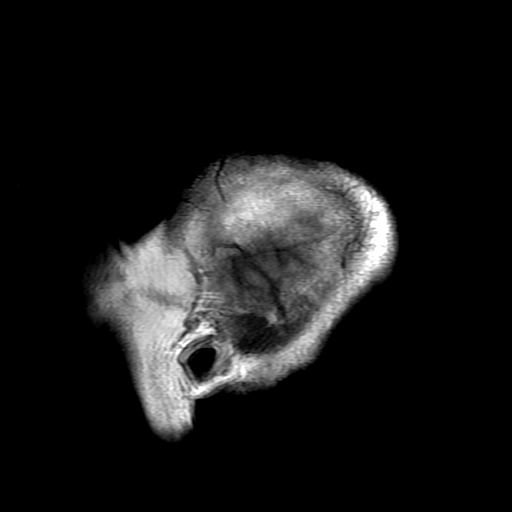
[im 14/27]
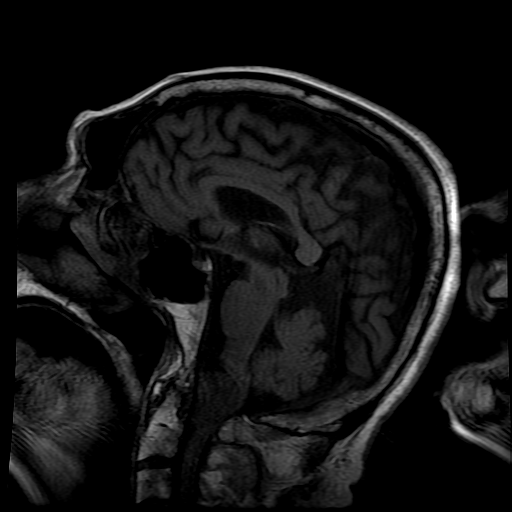
[im 27/27]
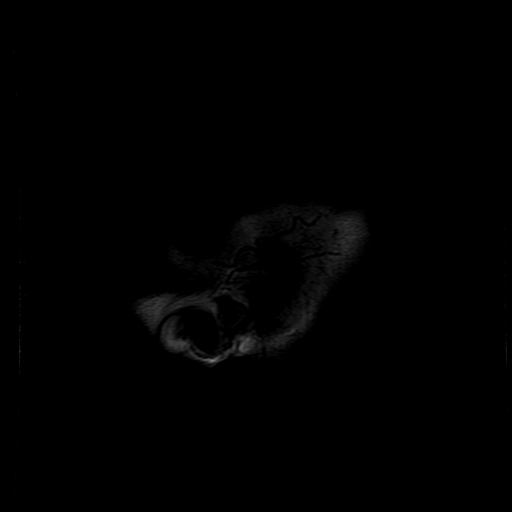

[Series 10: T2 · axial · 5.0mm · 0.43mm/px · z∈[-23,+113]mm · 3 of 28 slices shown (1 of 3)]
[im 1/28]
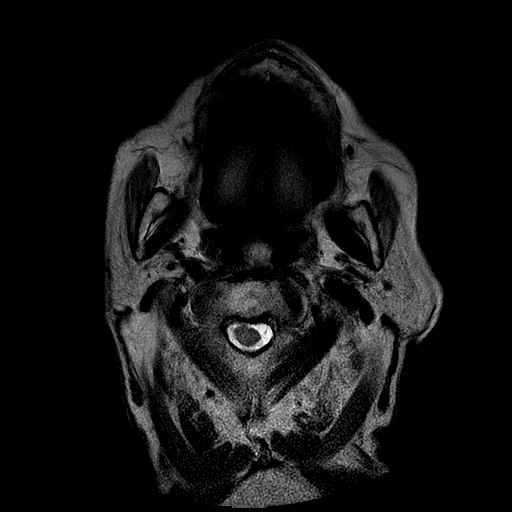
[im 14/28]
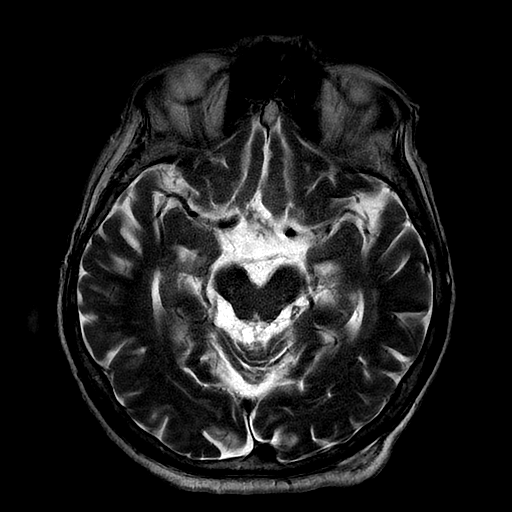
[im 28/28]
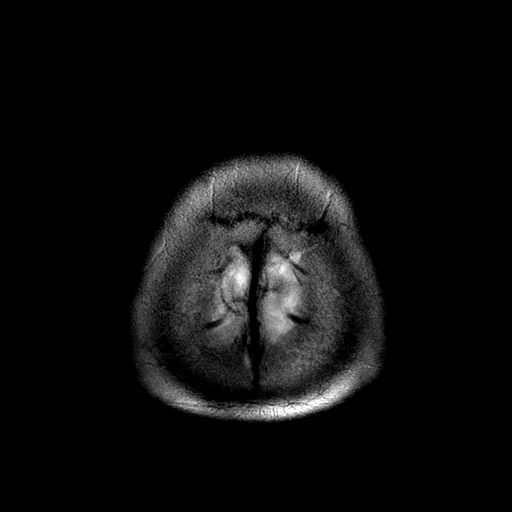

[Series 16: T2 · coronal · 5.0mm · 0.43mm/px · 2 of 26 slices shown (2 of 3)]
[im 1/26]
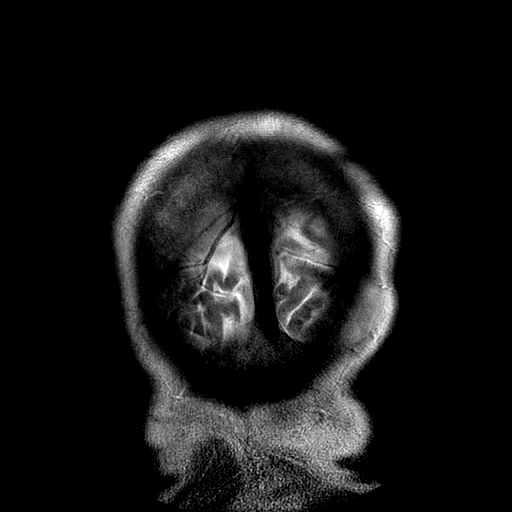
[im 26/26]
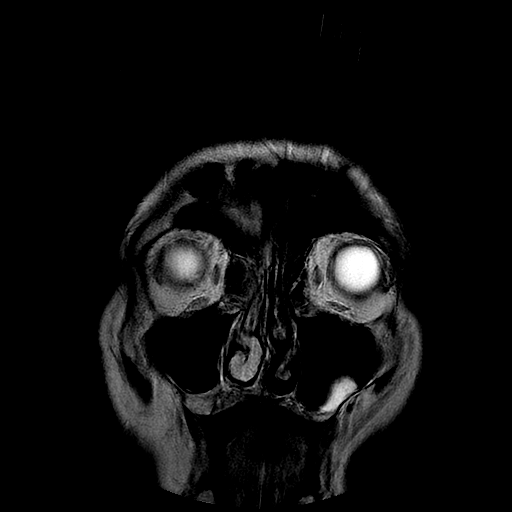

[Series 17: T1 · axial · 5.0mm · 0.47mm/px · z∈[-17,+119]mm · 2 of 28 slices shown (2 of 4)]
[im 1/28]
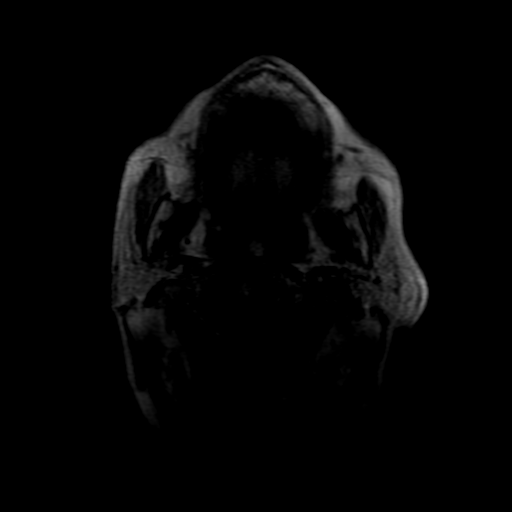
[im 28/28]
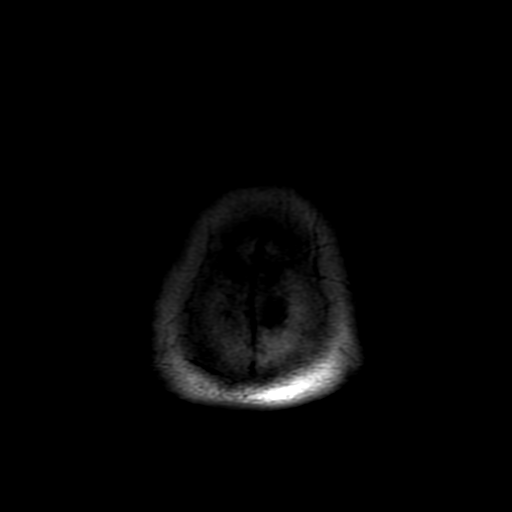

[Series 19: T1 · sagittal · 3.0mm · 0.43mm/px · 1 of 17 slices shown (3 of 4)]
[im 1/17]
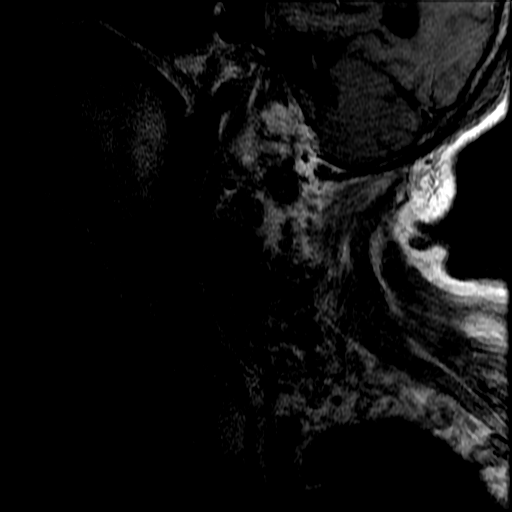

[Series 21: T2 · axial · 3.0mm · 0.39mm/px · z∈[-224,-116]mm · 2 of 32 slices shown (3 of 3)]
[im 1/32]
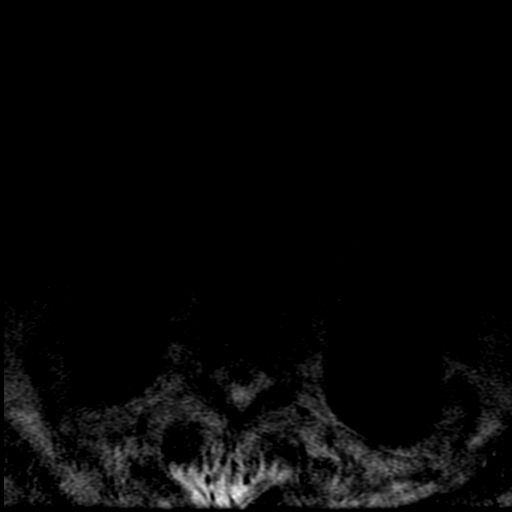
[im 32/32]
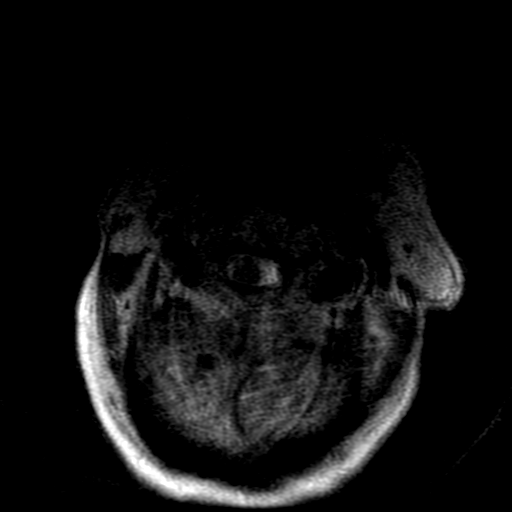

[Series 25: T1 · axial · non-contrast · 3.0mm · 0.39mm/px · z∈[-224,-116]mm · 2 of 32 slices shown (4 of 4)]
[im 1/32]
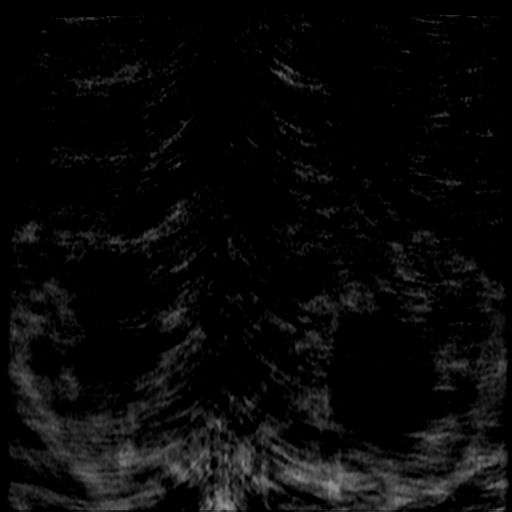
[im 32/32]
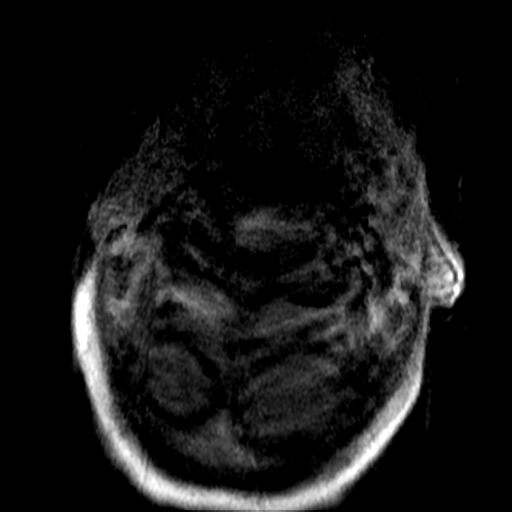

[Series 26: T2 post-contrast · sagittal · 3.0mm · 0.43mm/px · 1 of 17 slices shown]
[im 1/17]
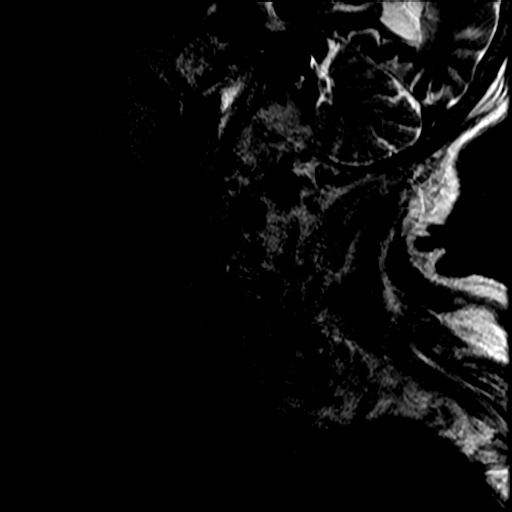

[Series 27: T1 fat-sat post-contrast · sagittal · 3.0mm · 0.86mm/px · 1 of 17 slices shown]
[im 1/17]
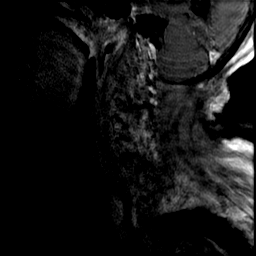

[Series 28: T1 post-contrast · axial · 3.0mm · 0.39mm/px · 1 of 32 slices shown]
[im 1/32]
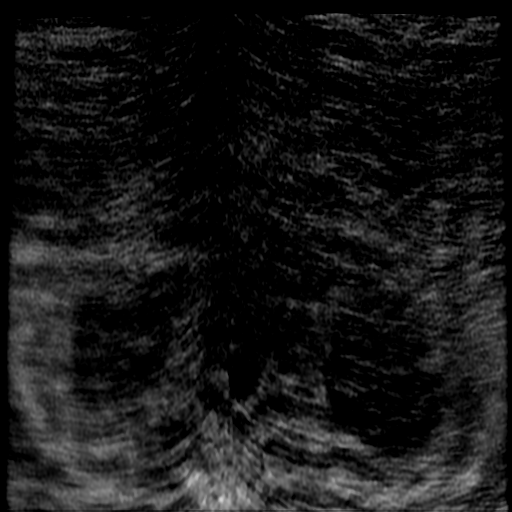

[18 of 48 positions shown; findings below may reference images not displayed]

FINDINGS: MRI HEAD FINDINGS

Moderate to severely motion degraded examination, patient received
sedation. The post gadolinium sequences are severely motion
degraded. Low T1, low T2 17 x 23 x 28 mm (transverse by AP by CC)
homogeneously enhancing mass with ventral dural tail consistent with
meningioma completely effaces the foramen magnum, laterally
displacing the vertebral artery flow voids which are present. The
spinal cord is flattened, displaced to the RIGHT without cord edema.
A second similar signal characteristic 6 x 11 mm mass arises from
the RIGHT petrous apex, within the pre pontine cistern.

No reduced diffusion to suggest acute ischemia. No lobar hematoma.
Motion degraded axial gradient sequences limit sensitivity for small
hemorrhage. Ventricles and sulci are normal for age. Patchy
supratentorial white matter T2 hyperintensities. No midline shift,
mass effect or nor abnormal parenchymal enhancement/mass.

No abnormal extra-axial fluid collections. Normal major intracranial
vascular flow voids seen at the skull base. Ocular globes and
orbital contents are normal; status post bilateral ocular lens
implants. Patient appears edentulous. LEFT maxillary mucosal
retention cyst. The mastoid air cells are well aerated.

MRI CERVICAL SPINE FINDINGS

Severely motion degraded examination. Re- demonstration of foramen
magnum meningioma. The vertebral bodies appear intact and aligned
with maintenance of the cervical lordosis. Intervertebral discs
demonstrate generally normal morphology, decreased T2 signal
consistent with desiccation.

Mild canal stenosis.  No definite spinal cord edema, no syrinx.

Due to severely motion degraded axial sequences, limited assessment
of individual levels.
IMPRESSION: Motion degraded examination, markedly limiting evaluation.

17 x 23 x 28 mm meningioma at the foramen magnum, resulting in
severe effacement of the spinal cord, no definite cord edema and no
syrinx though motion degrades evaluation.

6 x 11 mm meningioma RIGHT petrous apex/ pre pontine cistern.

No acute intracranial process. Involutional changes. Moderate white
matter changes compatible with chronic small vessel ischemic
disease.

No acute fracture malalignment of the cervical spine.

## 2017-03-30 IMAGING — CT CT CERVICAL SPINE W/ CM
1 series · 12 of 14 positions shown, 15 images · IV contrast (agent unspecified)
Comparison: Brain and cervical spine MRI 10/29/2014

CLINICAL DATA: Meningioma at the foramen magnum.

EXAM:
CT HEAD WITHOUT AND WITH CONTRAST
CT CERVICAL SPINE WITH CONTRAST
TECHNIQUE: Contiguous axial images were obtained from the base of the skull
through the vertex without and with intravenous contrast.
Multidetector CT imaging of the cervical spine was performed with
intravenous contrast. Multiplanar CT image reconstructions of the
cervical spine were also generated.

[Series 7: head 2.0 h30s brain · axial · 0.36mm/px · z∈[-334,-178]mm · 12 of 94 slices shown, 15 images]
[im 8/94  soft-tissue]
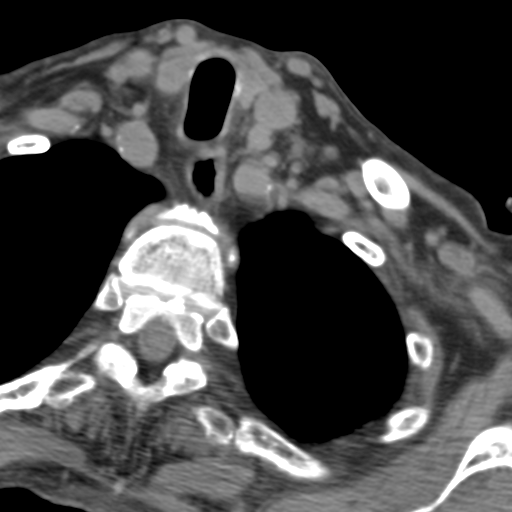
[im 8/94  bone]
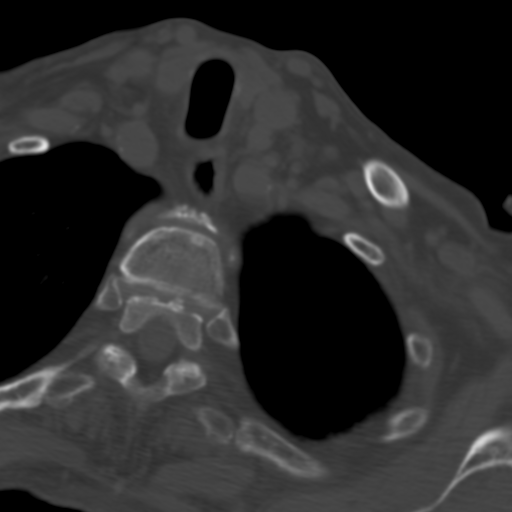
[im 15/94  bone]
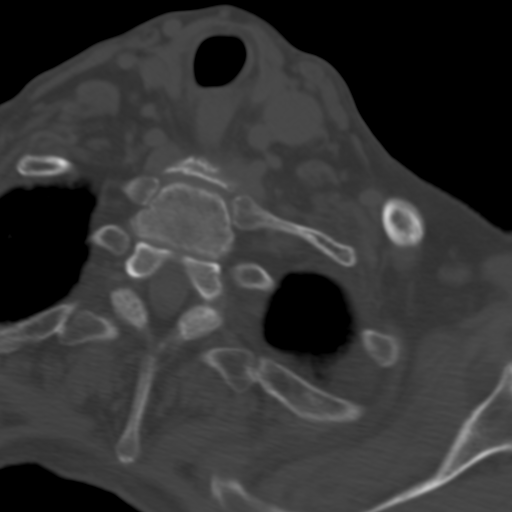
[im 22/94  bone]
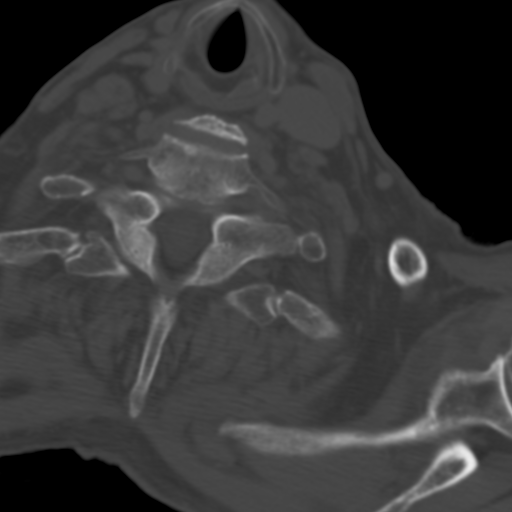
[im 29/94  bone]
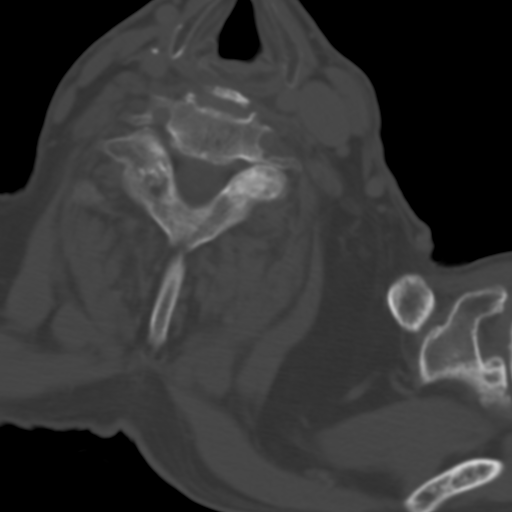
[im 36/94  soft-tissue]
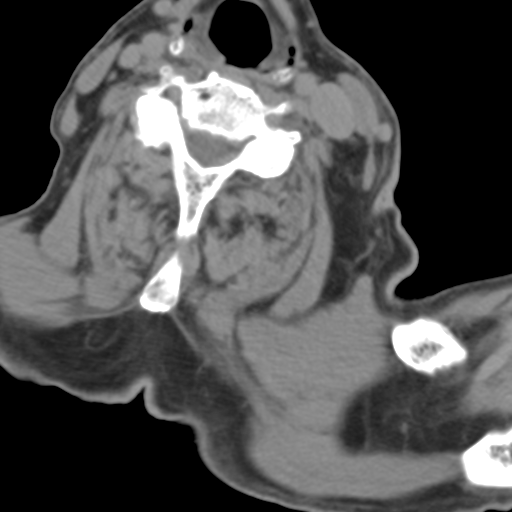
[im 36/94  bone]
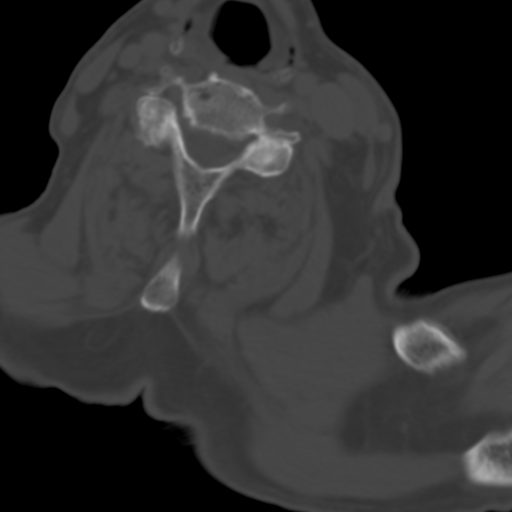
[im 43/94  bone]
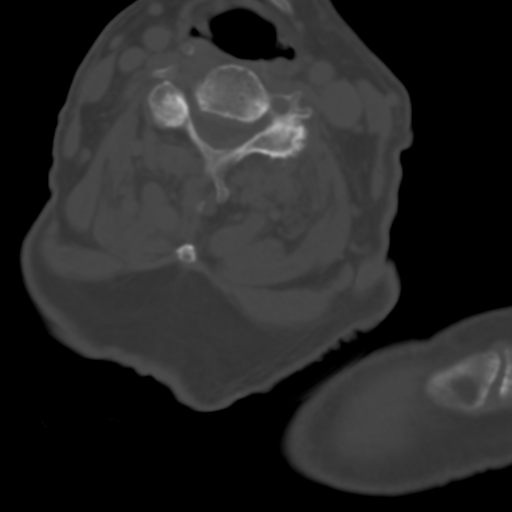
[im 51/94  bone]
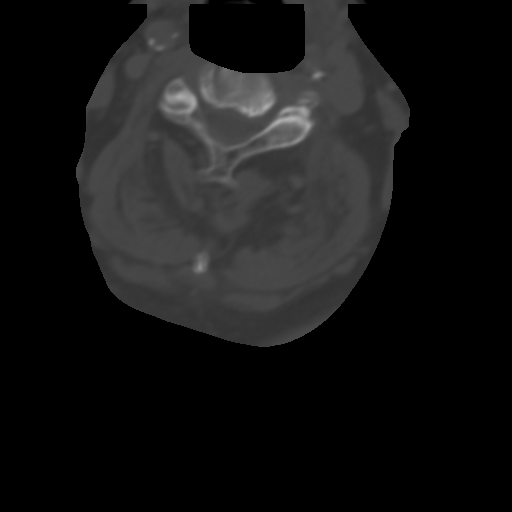
[im 58/94  bone]
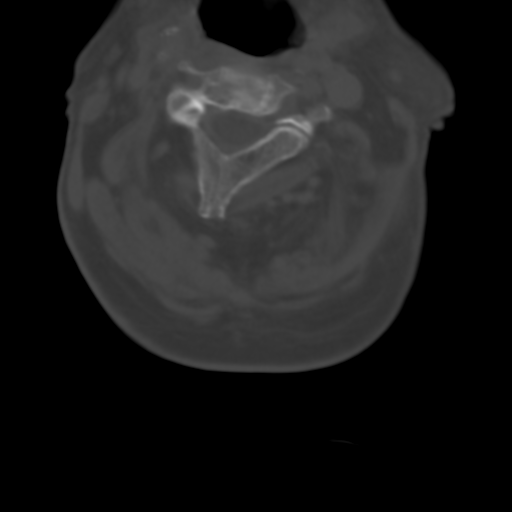
[im 65/94  soft-tissue]
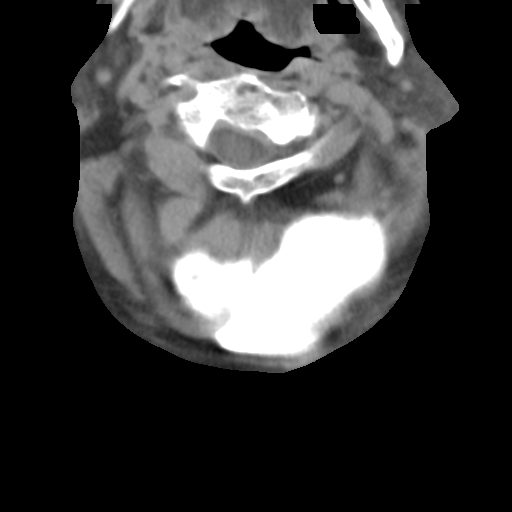
[im 65/94  bone]
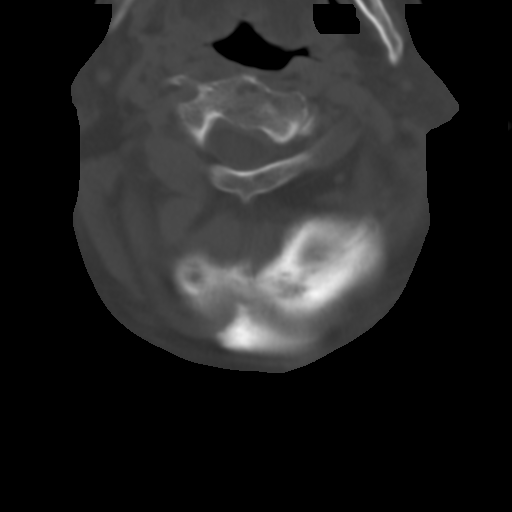
[im 72/94  bone]
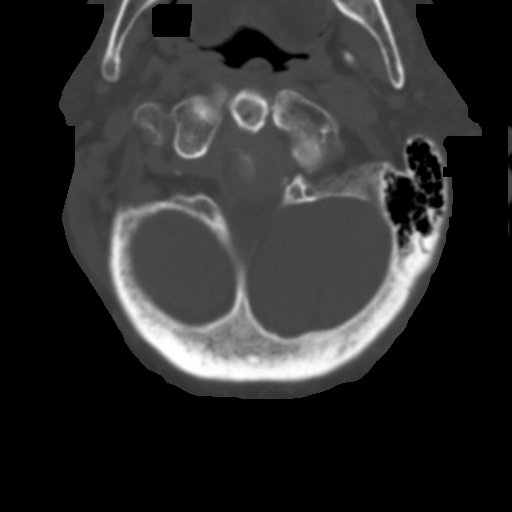
[im 79/94  bone]
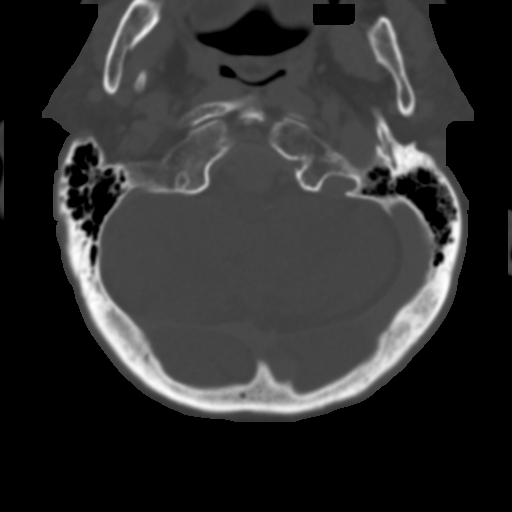
[im 86/94  bone]
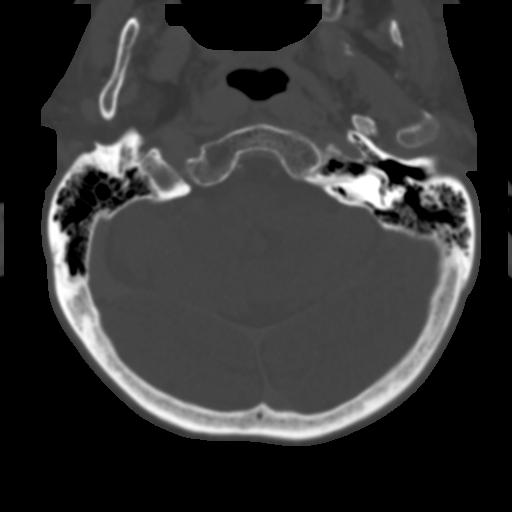

[12 of 14 positions shown; findings below may reference images not displayed]

FINDINGS: CT HEAD FINDINGS

Stealth protocol for surgical planning.  Mild motion artifact.

There is no evidence of acute large territory infarct, intracranial
hemorrhage, midline shift, or extra-axial fluid collection. There is
mild generalized cerebral atrophy with mild chronic small vessel
ischemic disease in the cerebral white matter.

Mastoid air cells are clear. Left maxillary sinus mucous retention
cysts are noted.

11 mm homogeneously enhancing extra-axial mass is again seen in the
prepontine cistern near the right petrous apex and posterior aspect
of the cavernous sinus.

Enhancing, partially calcified extra-axial mass in the ventral
aspect of the foramen magnum measures 2.0 x 1.8 x 2.9 cm (AP by
transverse by craniocaudal). As described on recent MRI, this
displaces the vertebral arteries laterally, and it posteriorly and
right laterally displaces and severely compresses the
cervicomedullary junction. The mass extends caudally to the level of
the base of the dens.

CT CERVICAL SPINE FINDINGS

The study is mildly motion degraded at the skullbase and in the
upper cervical spine.

There is exaggeration of the normal cervical lordosis. There is no
significant listhesis. Extra-axial mass in the ventral foramen
magnum as above. Vertebral body heights are preserved. 10 mm densely
sclerotic focus in the right aspect of C[DATE] represent a bone
island.

Intervertebral disc space heights overall appear relatively well
preserved. Mild multilevel endplate spurring is noted. There is
moderate left-sided facet arthrosis at C4-5. Disc bulging from C3-4
to C5-6 results in mild-to-moderate spinal stenosis.
Mild-to-moderate bilateral neural foraminal stenosis is also present
at these levels.

No cervical spine fracture is identified. Minimal scarring is noted
in the lung apices. Moderate atherosclerotic vascular calcification
is noted, particularly about the carotid bifurcations.
IMPRESSION: 1. 2.9 cm extra-axial mass ventrally in the foramen magnum
consistent with a meningioma as previously described. Severe
compression of the cervicomedullary junction.
2. 11 mm right petrous apex/pre pontine cistern a meningioma.
3. No evidence of acute intracranial abnormality.
4. Mild cervical spondylosis with mild to moderate spinal canal and
neural foraminal narrowing.
